# Patient Record
Sex: Female | Born: 1960
Health system: Southern US, Community
[De-identification: ages and names within clinical notes are randomized; demographics above are authoritative.]

## PROBLEM LIST (undated history)

## (undated) DIAGNOSIS — J449 Chronic obstructive pulmonary disease, unspecified: Secondary | ICD-10-CM

## (undated) DIAGNOSIS — R569 Unspecified convulsions: Secondary | ICD-10-CM

## (undated) DIAGNOSIS — R0600 Dyspnea, unspecified: Secondary | ICD-10-CM

## (undated) DIAGNOSIS — F419 Anxiety disorder, unspecified: Secondary | ICD-10-CM

## (undated) DIAGNOSIS — R51 Headache: Secondary | ICD-10-CM

## (undated) DIAGNOSIS — K219 Gastro-esophageal reflux disease without esophagitis: Secondary | ICD-10-CM

## (undated) DIAGNOSIS — R519 Headache, unspecified: Secondary | ICD-10-CM

## (undated) DIAGNOSIS — M1990 Unspecified osteoarthritis, unspecified site: Secondary | ICD-10-CM

## (undated) DIAGNOSIS — E7800 Pure hypercholesterolemia, unspecified: Secondary | ICD-10-CM

## (undated) HISTORY — DX: Pure hypercholesterolemia, unspecified: E78.00

## (undated) HISTORY — PX: CHOLECYSTECTOMY: SHX55

## (undated) HISTORY — PX: DILATION AND CURETTAGE OF UTERUS: SHX78

## (undated) HISTORY — PX: OTHER SURGICAL HISTORY: SHX169

---

## 1988-08-05 DIAGNOSIS — C4492 Squamous cell carcinoma of skin, unspecified: Secondary | ICD-10-CM

## 1988-08-05 HISTORY — DX: Squamous cell carcinoma of skin, unspecified: C44.92

## 1999-05-20 ENCOUNTER — Encounter: Payer: Self-pay | Admitting: *Deleted

## 1999-05-20 ENCOUNTER — Ambulatory Visit (HOSPITAL_COMMUNITY): Admission: RE | Admit: 1999-05-20 | Discharge: 1999-05-20 | Payer: Self-pay | Admitting: *Deleted

## 2000-05-20 ENCOUNTER — Ambulatory Visit (HOSPITAL_COMMUNITY): Admission: RE | Admit: 2000-05-20 | Discharge: 2000-05-20 | Payer: Self-pay | Admitting: *Deleted

## 2000-05-20 ENCOUNTER — Encounter: Payer: Self-pay | Admitting: *Deleted

## 2001-05-25 ENCOUNTER — Other Ambulatory Visit: Admission: RE | Admit: 2001-05-25 | Discharge: 2001-05-25 | Payer: Self-pay | Admitting: *Deleted

## 2001-08-20 ENCOUNTER — Ambulatory Visit (HOSPITAL_COMMUNITY): Admission: RE | Admit: 2001-08-20 | Discharge: 2001-08-20 | Payer: Self-pay | Admitting: Pulmonary Disease

## 2001-11-25 ENCOUNTER — Encounter: Payer: Self-pay | Admitting: *Deleted

## 2001-11-25 ENCOUNTER — Ambulatory Visit (HOSPITAL_COMMUNITY): Admission: RE | Admit: 2001-11-25 | Discharge: 2001-11-25 | Payer: Self-pay | Admitting: Internal Medicine

## 2001-12-06 DIAGNOSIS — D229 Melanocytic nevi, unspecified: Secondary | ICD-10-CM

## 2001-12-06 HISTORY — DX: Melanocytic nevi, unspecified: D22.9

## 2002-06-16 ENCOUNTER — Other Ambulatory Visit: Admission: RE | Admit: 2002-06-16 | Discharge: 2002-06-16 | Payer: Self-pay | Admitting: *Deleted

## 2002-09-06 ENCOUNTER — Ambulatory Visit (HOSPITAL_COMMUNITY): Admission: RE | Admit: 2002-09-06 | Discharge: 2002-09-06 | Payer: Self-pay | Admitting: Pulmonary Disease

## 2002-09-09 ENCOUNTER — Encounter (INDEPENDENT_AMBULATORY_CARE_PROVIDER_SITE_OTHER): Payer: Self-pay | Admitting: *Deleted

## 2002-09-09 ENCOUNTER — Ambulatory Visit (HOSPITAL_COMMUNITY): Admission: RE | Admit: 2002-09-09 | Discharge: 2002-09-09 | Payer: Self-pay | Admitting: General Surgery

## 2003-06-19 ENCOUNTER — Other Ambulatory Visit: Admission: RE | Admit: 2003-06-19 | Discharge: 2003-06-19 | Payer: Self-pay | Admitting: *Deleted

## 2003-11-22 ENCOUNTER — Ambulatory Visit (HOSPITAL_COMMUNITY): Admission: RE | Admit: 2003-11-22 | Discharge: 2003-11-22 | Payer: Self-pay | Admitting: *Deleted

## 2004-06-05 ENCOUNTER — Other Ambulatory Visit: Admission: RE | Admit: 2004-06-05 | Discharge: 2004-06-05 | Payer: Self-pay | Admitting: *Deleted

## 2004-10-16 ENCOUNTER — Ambulatory Visit (HOSPITAL_COMMUNITY): Admission: RE | Admit: 2004-10-16 | Discharge: 2004-10-16 | Payer: Self-pay | Admitting: Pulmonary Disease

## 2004-11-22 ENCOUNTER — Ambulatory Visit (HOSPITAL_COMMUNITY): Admission: RE | Admit: 2004-11-22 | Discharge: 2004-11-22 | Payer: Self-pay | Admitting: *Deleted

## 2004-11-26 ENCOUNTER — Ambulatory Visit: Payer: Self-pay | Admitting: Internal Medicine

## 2005-01-15 ENCOUNTER — Ambulatory Visit: Payer: Self-pay | Admitting: Internal Medicine

## 2005-01-24 ENCOUNTER — Ambulatory Visit: Payer: Self-pay | Admitting: Internal Medicine

## 2005-02-27 ENCOUNTER — Ambulatory Visit: Payer: Self-pay | Admitting: Internal Medicine

## 2005-03-11 ENCOUNTER — Ambulatory Visit: Payer: Self-pay | Admitting: Internal Medicine

## 2005-03-12 ENCOUNTER — Encounter (INDEPENDENT_AMBULATORY_CARE_PROVIDER_SITE_OTHER): Payer: Self-pay | Admitting: *Deleted

## 2005-03-12 ENCOUNTER — Other Ambulatory Visit: Admission: RE | Admit: 2005-03-12 | Discharge: 2005-03-12 | Payer: Self-pay | Admitting: Internal Medicine

## 2005-03-12 ENCOUNTER — Ambulatory Visit: Payer: Self-pay | Admitting: Internal Medicine

## 2005-03-12 HISTORY — PX: ESOPHAGOGASTRODUODENOSCOPY: SHX1529

## 2005-05-23 ENCOUNTER — Ambulatory Visit: Payer: Self-pay | Admitting: Internal Medicine

## 2005-06-02 ENCOUNTER — Ambulatory Visit: Payer: Self-pay | Admitting: Internal Medicine

## 2005-06-04 ENCOUNTER — Ambulatory Visit (HOSPITAL_COMMUNITY): Admission: RE | Admit: 2005-06-04 | Discharge: 2005-06-04 | Payer: Self-pay | Admitting: Internal Medicine

## 2005-06-05 ENCOUNTER — Ambulatory Visit: Payer: Self-pay

## 2005-06-11 ENCOUNTER — Ambulatory Visit: Payer: Self-pay | Admitting: Internal Medicine

## 2005-12-17 ENCOUNTER — Other Ambulatory Visit: Admission: RE | Admit: 2005-12-17 | Discharge: 2005-12-17 | Payer: Self-pay | Admitting: *Deleted

## 2006-06-19 ENCOUNTER — Emergency Department (HOSPITAL_COMMUNITY): Admission: EM | Admit: 2006-06-19 | Discharge: 2006-06-19 | Payer: Self-pay | Admitting: Emergency Medicine

## 2006-06-23 ENCOUNTER — Ambulatory Visit: Payer: Self-pay | Admitting: Internal Medicine

## 2006-07-24 ENCOUNTER — Ambulatory Visit: Payer: Self-pay | Admitting: Internal Medicine

## 2006-11-26 ENCOUNTER — Ambulatory Visit: Payer: Self-pay | Admitting: Cardiology

## 2006-12-03 ENCOUNTER — Encounter: Payer: Self-pay | Admitting: Cardiology

## 2006-12-03 ENCOUNTER — Ambulatory Visit: Payer: Self-pay

## 2006-12-17 ENCOUNTER — Ambulatory Visit: Payer: Self-pay | Admitting: Cardiology

## 2007-05-25 ENCOUNTER — Other Ambulatory Visit: Admission: RE | Admit: 2007-05-25 | Discharge: 2007-05-25 | Payer: Self-pay | Admitting: *Deleted

## 2007-05-28 ENCOUNTER — Ambulatory Visit: Payer: Self-pay | Admitting: Internal Medicine

## 2007-05-28 LAB — CONVERTED CEMR LAB
Albumin: 4.2 g/dL (ref 3.5–5.2)
Basophils Relative: 0.7 % (ref 0.0–1.0)
Bilirubin Urine: NEGATIVE
Crystals: NEGATIVE
Eosinophils Absolute: 0.1 10*3/uL (ref 0.0–0.6)
Eosinophils Relative: 1.5 % (ref 0.0–5.0)
GFR calc non Af Amer: 95 mL/min
HCT: 43.4 % (ref 36.0–46.0)
Lymphocytes Relative: 38.8 % (ref 12.0–46.0)
MCV: 90.4 fL (ref 78.0–100.0)
Mucus, UA: NEGATIVE
Neutro Abs: 3.2 10*3/uL (ref 1.4–7.7)
Neutrophils Relative %: 51.9 % (ref 43.0–77.0)
Potassium: 3.9 meq/L (ref 3.5–5.1)
RBC: 4.8 M/uL (ref 3.87–5.11)
Sodium: 140 meq/L (ref 135–145)
Specific Gravity, Urine: <=1.005 (ref 1.000–1.03)
TSH: 1.75 microintl units/mL (ref 0.35–5.50)
Total Protein, Urine: NEGATIVE mg/dL
Urine Glucose: NEGATIVE mg/dL
WBC: 6 10*3/uL (ref 4.5–10.5)
pH: 6 (ref 5.0–8.0)

## 2007-06-03 ENCOUNTER — Ambulatory Visit: Payer: Self-pay | Admitting: Cardiovascular Disease

## 2007-07-09 ENCOUNTER — Ambulatory Visit: Payer: Self-pay | Admitting: Internal Medicine

## 2009-05-30 ENCOUNTER — Telehealth: Payer: Self-pay | Admitting: Internal Medicine

## 2009-06-01 ENCOUNTER — Ambulatory Visit: Payer: Self-pay | Admitting: Gastroenterology

## 2009-06-01 DIAGNOSIS — R109 Unspecified abdominal pain: Secondary | ICD-10-CM | POA: Insufficient documentation

## 2009-06-01 DIAGNOSIS — K2980 Duodenitis without bleeding: Secondary | ICD-10-CM | POA: Insufficient documentation

## 2009-06-01 DIAGNOSIS — K209 Esophagitis, unspecified without bleeding: Secondary | ICD-10-CM | POA: Insufficient documentation

## 2009-06-01 DIAGNOSIS — R141 Gas pain: Secondary | ICD-10-CM | POA: Insufficient documentation

## 2009-06-01 DIAGNOSIS — R143 Flatulence: Secondary | ICD-10-CM

## 2009-06-01 DIAGNOSIS — R142 Eructation: Secondary | ICD-10-CM

## 2009-06-01 DIAGNOSIS — N809 Endometriosis, unspecified: Secondary | ICD-10-CM | POA: Insufficient documentation

## 2009-06-01 DIAGNOSIS — R1032 Left lower quadrant pain: Secondary | ICD-10-CM | POA: Insufficient documentation

## 2009-06-04 LAB — CONVERTED CEMR LAB
Eosinophils Relative: 1.6 % (ref 0.0–5.0)
HCT: 41 % (ref 36.0–46.0)
Hemoglobin: 14.5 g/dL (ref 12.0–15.0)
Lymphs Abs: 1.7 10*3/uL (ref 0.7–4.0)
Monocytes Relative: 6.5 % (ref 3.0–12.0)
Neutro Abs: 2.5 10*3/uL (ref 1.4–7.7)
RBC: 4.5 M/uL (ref 3.87–5.11)
Sed Rate: 11 mm/hr (ref 0–22)
WBC: 4.6 10*3/uL (ref 4.5–10.5)

## 2009-06-05 ENCOUNTER — Ambulatory Visit: Payer: Self-pay | Admitting: Internal Medicine

## 2009-06-05 ENCOUNTER — Telehealth (INDEPENDENT_AMBULATORY_CARE_PROVIDER_SITE_OTHER): Payer: Self-pay | Admitting: *Deleted

## 2009-06-07 ENCOUNTER — Telehealth: Payer: Self-pay | Admitting: Internal Medicine

## 2009-06-12 ENCOUNTER — Ambulatory Visit: Payer: Self-pay | Admitting: Internal Medicine

## 2009-06-12 ENCOUNTER — Encounter: Payer: Self-pay | Admitting: Internal Medicine

## 2009-06-14 ENCOUNTER — Encounter: Payer: Self-pay | Admitting: Internal Medicine

## 2009-08-22 DIAGNOSIS — F411 Generalized anxiety disorder: Secondary | ICD-10-CM | POA: Insufficient documentation

## 2009-08-28 ENCOUNTER — Ambulatory Visit: Payer: Self-pay | Admitting: Internal Medicine

## 2009-08-28 DIAGNOSIS — Z860100 Personal history of colon polyps, unspecified: Secondary | ICD-10-CM | POA: Insufficient documentation

## 2009-08-29 LAB — CONVERTED CEMR LAB
Ketones, ur: NEGATIVE mg/dL
Specific Gravity, Urine: <=1.005 (ref 1.000–1.030)
pH: 8 (ref 5.0–8.0)

## 2009-09-13 ENCOUNTER — Encounter: Payer: Self-pay | Admitting: Internal Medicine

## 2009-11-16 ENCOUNTER — Ambulatory Visit (HOSPITAL_COMMUNITY): Admission: RE | Admit: 2009-11-16 | Discharge: 2009-11-16 | Payer: Self-pay | Admitting: Obstetrics and Gynecology

## 2010-10-07 ENCOUNTER — Ambulatory Visit (HOSPITAL_COMMUNITY)
Admission: RE | Admit: 2010-10-07 | Discharge: 2010-10-07 | Payer: Self-pay | Source: Home / Self Care | Attending: Pulmonary Disease | Admitting: Pulmonary Disease

## 2011-03-14 NOTE — Op Note (Signed)
NAME:  ATHALENE, KOLLE                          ACCOUNT NO.:  [id number]   MEDICAL RECORD NO.:  [id number]                   PATIENT TYPE:  AMB   LOCATION:  DAY                                  FACILITY:  [facility]   PHYSICIAN:  Dalia Heading, M.D.               DATE OF BIRTH:  Jul 12, 1961   DATE OF PROCEDURE:  09/09/2002  DATE OF DISCHARGE:                                 OPERATIVE REPORT   AGE:  50 years old.   CHIEF COMPLAINT:  Bilary colic, cholelithiasis.   POSTOPERATIVE DIAGNOSIS:  Biliary colic, cholelithiasis.   PROCEDURE:  Laparoscopic cholecystectomy.   SURGEON:  Dalia Heading, M.D.   ANESTHESIA:  General endotracheal.   INDICATIONS:  The patient is a 50 year old white female who presents with  biliary colic secondary to cholelithiasis.  The risks and benefits of the  procedure, including bleeding, infection, hepatobiliary injury, and the  possibility of an open procedure were fully explained to the patient, and  gave informed consent.   PROCEDURE NOTE:  The patient was placed in the supine position.  After  induction of general endotracheal anesthesia, the abdomen was prepped and  draped using the usual sterile technique with Betadine.   A supraumbilical incision was made down to the fascia.  Veress needle was  then introduced into the abdominal cavity, and confirmation of placement was  done using the saline drop test.  The abdomen was then insufflated to 16  mmHg of pressure.  An 11-mm trocar was then introduced into the abdominal  cavity under direct visualization without difficulty.  An additional 11-mm  trocar was placed in the epigastric region, and 5-mm trocars were placed in  the right upper quadrant and right flank regions.  The liver was inspected  and noted to be within normal limits.  The gallbladder was retracted  superiorly and laterally.  The dissection was begun around the infundibulum  of the gallbladder.  The cystic duct was first  identified.  Its junction to  the infundibulum fully identified.  Endoclips were placed proximally and  distally on the cystic duct, and the cystic duct was divided.  This was  likewise done with the cystic artery.   The gallbladder was then freed away from the gallbladder fossa using Bovie  electrocautery.  The gallbladder was delivered through the epigastric trocar  site using an EndoCatch bag.  The gallbladder fossa was inspected.  No  abnormal bleeding or bile leakage was noted.  Surgicel was placed in the  gallbladder fossa.  The subhepatic space as well as the right hepatic gutter  were irrigated with normal saline.  All fluid and air were then evacuated  from the abdominal cavity prior to removal of the trocars.   All wounds were irrigated with normal saline.  All wounds were injected with  0.5% Sensorcaine.  The supraumbilical fascia as well as epigastric fascia  reapproximated using an #  0 Vicryl interrupted suture.  All skin incisions  were closed using staples.  Betadine ointment and dry sterile dressings were  applied.   All instrument and needle counts were correct at the end of the procedure.  The patient was extubated in the operating room and went back to the  recovery room awake in stable condition.   COMPLICATIONS:  None.   SPECIMENS:  Gallbladder with stumps.   BLOOD LOSS:  Minimal.                                                Dalia Heading, M.D.    MAJ/MEDQ  D:  09/09/2002  T:  09/09/2002  Job:  [id number]   cc:   Ramon Dredge L. Juanetta Gosling, M.D.  [address]  [place]  [place] [address]  Fax: [phone]

## 2011-03-14 NOTE — Assessment & Plan Note (Signed)
[facility]                            CARDIOLOGY OFFICE NOTE   NAME:[name], Gabriela PETRONIO                       MRN:          [id number]  DATE:11/26/2006                            DOB:          Jun 07, 1961    Gabriela Maduro received cardiac evaluation.  She had a significant  syncopal episode in August 2007.  Since then, she has had 1 minor spell  of feeling dizzy and possibly presyncopal.  Her workup by Dr. Joana Reamer  showed that there did not appear to be any primary neurologic problem.  She was not allowed to drive for 3 months, and this has passed.  She is  stable and going about full activities.  She has historically worked  doing Education officer, environmental.  At this time, she is sitting with a gentleman who is  being watched, as he is becoming weaker with lung cancer.   In the past, the patient has had vasovagal-type of symptoms.  She had a  very brief syncopal episode with pain in her hand on 1 occasion in the  past.  However, when this has happened in the past, her spells have been  extremely limited, and she wakes up right of way.  The spell in August  2007 the patient had been outside in the heat in her yard, and then was  sitting on the back porch.  She then began to feel poorly, and she  passed out.  This was witnessed by her husband.  This was not a brief  episode.  She had heavy breathing.  There was question of some clonic  contractive type of movement of her left arm and left leg.  There was  some deviation in her eyes.  She did not have urinary or stool  incontinence.  She awoke, and there was some slurring in her speech, and  then she recovered over approximately a week.   At this point the patient does not have chest pain.  There is no  shortness of breath.  There is no family history of sudden cardiac  death.   PAST MEDICAL HISTORY:   ALLERGIES:  CODEINE.   MEDICATIONS:  1. Nexium.  2. Xanax 1 mg at night.   OTHER MEDICAL PROBLEMS:  See the list  below.   SOCIAL HISTORY:  The patient does smoke 1/2 pack per day, and of course  she is encouraged to stop.  She is married.   FAMILY HISTORY:  Both parents have passed away from cancer, and there is  no history of coronary disease at a young age.  There is no history of  sudden cardiac death.   REVIEW OF SYSTEMS:  She is not having any GI or GU symptoms.  In the  past few weeks she has had some discomfort in both shoulders with arm  movement.  This does not sound cardiac in origin.  She has had some very  mild dizzy spells.  Otherwise, her review of systems is negative.   PHYSICAL EXAMINATION:  The patient is well developed and well nourished.  Her husband is present throughout the evaluation.  She is oriented x3.  Affect is normal.  There is no xanthelasma.  There is normal extraocular motion.  There are no carotid bruits.  There is no jugular venous distention.  LUNGS:  Clear.  Respiratory effort is not labored.  CARDIAC:  Exam reveals an S1 with an S2.  There are no clicks or  significant murmurs.  ABDOMEN:  Soft.  There are no masses or bruits.  There is no peripheral edema.  She has 2+ distal pulses.  There are no musculoskeletal deformities.  Weight is 141 pounds.   EKG today is normal.   PROBLEMS:  1. Bilateral shoulder discomfort, which sounds musculoskeletal and      mild.  2. Question of some tendencies towards in the past, but no documented      major panic abnormality.  3. Status post hysterectomy.  4. Smoking 1 pack per day.  5. Allergy to codeine.  6. Episodes in the past that sound like vasovagal syncope.  7. More marked syncopal episode in August 2007 with a period of      prolonged changes including some slurred speech.  At this time, the      exact etiology is not clear.  It is of note that on that day the      patient had worked outside in the heat, and the felt poorly before      she had her spell.  It is possible that she had a type of       neurocardiogenic event added to a dehydrated state, which led to a      more marked neurologic event.  At this time, there are no obvious      cardiac abnormalities.  We will proceed with 2D echocardiogram to      be sure that left ventricular function and her valves are normal.      In addition, she will wear a 3 or 4 week CardioNet monitor, as she      does feel some palpitations rarely from time to time, and she has      had some mild dizziness from time to time.  She appears to be      stable for full activities.  I reminded her to try to be sure that      she remains hydrated.  Also, I reminded her that if she began to      feel somewhat dizzy, that she should stop and lie down and relax.   I will see her for followup.     Luis Abed, MD, [facility]  Electronically Signed    JDK/MedQ  DD: 11/26/2006  DT: 11/26/2006  Job #: [id number]   cc:   Georgina Quint. Plotnikov, MD  Gustavus Messing. Orlin Hilding, M.D.

## 2011-03-14 NOTE — H&P (Signed)
   NAME:  Gabriela [name], Gabriela [name]                          ACCOUNT NO.:  [id number]   MEDICAL RECORD NO.:  [id number]                   PATIENT TYPE:   LOCATION:  RAD                                  FACILITY:  [facility]   PHYSICIAN:  Dalia Heading, M.D.               DATE OF BIRTH:  [birth year]   DATE OF ADMISSION:  09/09/2002  DATE OF DISCHARGE:                                HISTORY & PHYSICAL   CHIEF COMPLAINT:  Biliary colic secondary to cholelithiasis.   HISTORY OF PRESENT ILLNESS:  This patient is a 50 year old white female who  is referred for evaluation and treatment of biliary colic secondary to  cholelithiasis.  She has been having worsening right upper quadrant  abdominal pain, nausea, bloating, and fatty food intolerance over the past  month. This has been getting worse as the time has gone on.  No fever,  chills, or jaundice had been noted.   PAST MEDICAL HISTORY:  Includes an anxiety disorder.   PAST SURGICAL HISTORY:  D&C in October 2003 at [facility],  diagnostic laparoscopy in the past.   CURRENT MEDICATIONS:  Xanax.   ALLERGIES:  Codeine.   SOCIAL HISTORY:  The patient does smoke a pack of cigarettes a day.  She  denies any significant alcohol use.   REVIEW OF SYSTEMS:  She denies any other cardiopulmonary difficulties or  bleeding disorders.   PHYSICAL EXAMINATION:  GENERAL:  The patient is a well-developed, and well-  nourished, white female in no acute distress.  VITAL SIGNS:  She is afebrile and vital signs are stable.  HEENT:  Examination reveals no scleral icterus.  LUNGS:  Clear to auscultation with equal breath sounds bilaterally.  HEART:  Examination reveals a regular rate and rhythm without S3, S4, or  murmurs.  ABDOMEN:  The abdomen is soft with tenderness noted in the right upper  quadrant to palpation.  No hepatosplenomegaly, masses or hernias are noted.   X-RAY AND LABORATORY DATA:  Ultrasound of the gallbladder reveals  cholelithiasis with a normal common bile duct.   IMPRESSION:  1. Biliary colic.  2. Cholelithiasis.   PLAN:  The patient is scheduled for laparoscopic cholecystectomy on  09/09/02.  The risks and benefits of the procedure including bleeding,  infection, hepatobiliary injury, and the possibility of an open procedure  were fully explained to the patient who gave informed consent.                                               Dalia Heading, M.D.    MAJ/MEDQ  D:  09/08/2002  T:  09/08/2002  Job:  [id number]   cc:   Ramon Dredge L. Juanetta Gosling, M.D.  [address]  [place]  [place] [address]  Fax: [phone]

## 2011-03-14 NOTE — Assessment & Plan Note (Signed)
 [facility]                            CARDIOLOGY OFFICE NOTE   NAME:[name], Gabriela NAPIERALA                       MRN:          [id number]  DATE:12/17/2006                            DOB:          01/24/1961    [name] is here for followup. See my complete note of November 26, 2006. The patient had had a syncopal episode. We did decide to proceed  with the 2D echo. That study was done on December 03, 2006 and it is  normal. Also she has worn a CardioNet monitor and she has had no  significant symptoms and the automatic checks have shown sinus rhythm.  She is here today and stable, feeling well, and doing well.   A blood pressure today is 126/95, with a pulse of 90.   Patient is doing well. She has had no recurring symptoms. Problems are  listed on my note of November 26, 2006.   At this point it would appear that her syncopal episode may have been a  neurocardiogenic event when she was dehydrated which led to a more  significant episode of syncope that included some slurred speech. She  has recovered.   I am recommending no other cardiac workup at this time. I will be happy  to see her in the future if needed.     Luis Abed, MD, [facility]  Electronically Signed    JDK/MedQ  DD: 12/17/2006  DT: 12/17/2006  Job #: [id number]   cc:   Georgina Quint. Plotnikov, MD  Gustavus Messing. Orlin Hilding, M.D.

## 2011-07-14 ENCOUNTER — Ambulatory Visit (HOSPITAL_COMMUNITY)
Admission: RE | Admit: 2011-07-14 | Discharge: 2011-07-14 | Disposition: A | Discharge status: Home / Self Care | Payer: BC Managed Care – PPO | Source: Ambulatory Visit | Attending: Pulmonary Disease | Admitting: Pulmonary Disease

## 2011-07-14 ENCOUNTER — Other Ambulatory Visit (HOSPITAL_COMMUNITY): Payer: Self-pay | Admitting: Pulmonary Disease

## 2011-07-14 DIAGNOSIS — R05 Cough: Secondary | ICD-10-CM

## 2011-07-14 DIAGNOSIS — IMO0001 Reserved for inherently not codable concepts without codable children: Secondary | ICD-10-CM

## 2011-07-14 DIAGNOSIS — R059 Cough, unspecified: Secondary | ICD-10-CM

## 2011-12-05 ENCOUNTER — Other Ambulatory Visit (HOSPITAL_COMMUNITY): Payer: Self-pay | Admitting: Pulmonary Disease

## 2011-12-09 ENCOUNTER — Ambulatory Visit (HOSPITAL_COMMUNITY)
Admission: RE | Admit: 2011-12-09 | Discharge: 2011-12-09 | Disposition: A | Discharge status: Home / Self Care | Payer: BC Managed Care – PPO | Source: Ambulatory Visit | Attending: Pulmonary Disease | Admitting: Pulmonary Disease

## 2011-12-09 DIAGNOSIS — R109 Unspecified abdominal pain: Secondary | ICD-10-CM | POA: Insufficient documentation

## 2011-12-09 DIAGNOSIS — R197 Diarrhea, unspecified: Secondary | ICD-10-CM | POA: Insufficient documentation

## 2011-12-09 MED ORDER — IOHEXOL 300 MG/ML  SOLN
100.0000 mL | Freq: Once | INTRAMUSCULAR | Status: AC | PRN
  Administered 2011-12-09: 100 mL via INTRAVENOUS

## 2011-12-17 ENCOUNTER — Encounter (INDEPENDENT_AMBULATORY_CARE_PROVIDER_SITE_OTHER): Payer: Self-pay | Admitting: *Deleted

## 2011-12-29 ENCOUNTER — Encounter (INDEPENDENT_AMBULATORY_CARE_PROVIDER_SITE_OTHER): Payer: Self-pay | Admitting: Internal Medicine

## 2011-12-29 ENCOUNTER — Ambulatory Visit (INDEPENDENT_AMBULATORY_CARE_PROVIDER_SITE_OTHER): Payer: BC Managed Care – PPO | Admitting: Internal Medicine

## 2011-12-29 DIAGNOSIS — R109 Unspecified abdominal pain: Secondary | ICD-10-CM

## 2011-12-29 DIAGNOSIS — K589 Irritable bowel syndrome without diarrhea: Secondary | ICD-10-CM

## 2011-12-29 MED ORDER — DICYCLOMINE HCL 10 MG PO CAPS: 10.0000 mg | ORAL_CAPSULE | Freq: Four times a day (QID) | ORAL | Status: DC

## 2011-12-29 NOTE — Patient Instructions (Signed)
F/u in 1 month. Dicyclomine 10mg  QID.

## 2011-12-29 NOTE — Progress Notes (Signed)
Subjective:     Patient ID: Gabriela [name], female   DOB: 1961/01/01, 51 y.o.   MRN: [id number]  HPI  Gabriela [name] is a 51 yr old female referred to our office by Dr. Juanetta [name] for abdominal pain and bloating.  January 25 of this year, she began to have lower abdominal cramps. She saw her GYN and told was normal exam. US abdomen was normal.  She says sometimes the pain will be in the rt lower quadrant and radiates across. The pain has not been as bad since Friday.  This pain does not have anything to do with her bowel movements. No pain with foods.  She says if she opens a mountain dew, the straining will hurt her lower abdomen.  No rectal bleeding or melena Appetite is good. No weight loss.  Sometimes she will have epigastric pain. BM x 2-3 times a day sometimes.  Her GI evaluation has been negative. A colonoscopy in August 2010 by Gabriela [name] showed mild diverticulosis and 2 adenomatous polyps. She has a history of severe endometriosis.  She continues to have abdominal discomfort which is partially relieved by Advil.     12/09/2011 CT abdomen and pelvis with CM:  12/03/2010 H and H 14.9 and 43.2, NA 139, K 4.4, Creatinine 0.71, bili 0.4, AST 18, ALP 61, ALT 13, Total protein 7.3, Albumin 4.8   Review of Systems   IMPRESSION:  Normal, stable CT scan of the abdomen and pelvis.  Current Outpatient Prescriptions  Medication Sig Dispense Refill  . ALPRAZolam (XANAX) 1 MG tablet Take 1 mg by mouth at bedtime as needed.      . simvastatin (ZOCOR) 40 MG tablet Take 40 mg by mouth every evening.       Past Medical History  Diagnosis Date  . High cholesterol    Past Surgical History  Procedure Date  . Cholecystectomy   . Dilation and curettage of uterus   . Cyst removed from ovaries    History   Social History  . Marital Status: Married    Spouse Name: N/A    Number of Children: N/A  . Years of Education: N/A   Occupational History  . Not on file.   Social History Main Topics  . Smoking  status: Current Everyday Smoker  . Smokeless tobacco: Not on file   Comment: 1/2 pack a day for 20 yrs  . Alcohol Use: No  . Drug Use: No  . Sexually Active: Not on file   Other Topics Concern  . Not on file   Social History Narrative  . No narrative on file   Family Status  Relation Status Death Age  . Mother Deceased     Stomach cancer  . Father Deceased     Prostate caner   Allergies  Allergen Reactions  . Codeine        Objective:   Physical Exam Filed Vitals:   12/29/11 1000  Height: 5\' 2"  (1.575 m)  Weight: 136 lb 12.8 oz (62.052 kg)  Alert and oriented. Skin warm and dry. Oral mucosa is moist.   . Sclera anicteric, conjunctivae is pink. Thyroid not enlarged. No cervical lymphadenopathy. Lungs clear. Heart regular rate and rhythm.  Abdomen is soft. Bowel sounds are positive. No hepatomegaly. No abdominal masses felt. No tenderness.  No edema to lower extremities. Patient is alert and oriented.       Assessment:   Abdominal pain. Doubt this is GI origin. This could be endometriosis.  Possible IBS.  I discussed this case with Dr. Juanetta [name]    Plan:Dicyclomine 10mg  QID. If constipation occurs, try Colace.  OV in 1 month

## 2012-01-06 ENCOUNTER — Ambulatory Visit (INDEPENDENT_AMBULATORY_CARE_PROVIDER_SITE_OTHER): Payer: BC Managed Care – PPO | Admitting: Internal Medicine

## 2012-01-16 ENCOUNTER — Encounter (INDEPENDENT_AMBULATORY_CARE_PROVIDER_SITE_OTHER): Payer: Self-pay

## 2012-01-26 ENCOUNTER — Encounter (INDEPENDENT_AMBULATORY_CARE_PROVIDER_SITE_OTHER): Payer: Self-pay | Admitting: Internal Medicine

## 2012-01-26 ENCOUNTER — Ambulatory Visit (INDEPENDENT_AMBULATORY_CARE_PROVIDER_SITE_OTHER): Payer: BC Managed Care – PPO | Admitting: Internal Medicine

## 2012-01-26 DIAGNOSIS — R109 Unspecified abdominal pain: Secondary | ICD-10-CM

## 2012-01-26 MED ORDER — CILIDINIUM-CHLORDIAZEPOXIDE 2.5-5 MG PO CAPS: 1.0000 | ORAL_CAPSULE | Freq: Three times a day (TID) | ORAL | Status: DC

## 2012-01-26 NOTE — Progress Notes (Signed)
Subjective:     Patient ID: Gabriela [name], female   DOB: July 03, 1961, 51 y.o.   MRN: [id number]  HPI Gabriela [name] is a 51 yr old female here for follow of her lower abdominal pain since January 25,2013.  She tells me the Dicyclomine has really not helped her pain. The pain is actually over ovaries. The pain is occurring every day. The pain starts when she wakes up in the morning.  She says today the pain is located in her left lower abdomen.  She tells me her BMs are normal.  No melena or bright red rectal bleeding.   She tells me the pain feels like she is getting ready to start her period.  Appetite is good. No weight loss. Occasionally has acid reflux.     Gabriela [name] was last seen around the first of March for abdominal pain and bloating.January 25 of this year, she began to have lower abdominal cramps. She saw her GYN and told was normal exam. US abdomen was normal. She says sometimes the pain will be in the rt lower quadrant and radiates across. The pain has not been as bad since Friday. This pain does not have anything to do with her bowel movements. No pain with foods. She says if she opens a mountain dew, the straining will hurt her lower abdomen. No rectal bleeding or melena  Appetite is good. No weight loss. Sometimes she will have epigastric pain. BM x 2-3 times a day sometimes. She was started on Dicyclomine after this visit. This case was discussed with Dr. Karilyn Cota. Her GI evaluation has been negative. A colonoscopy in August 2010 by Bonnye Fava showed mild diverticulosis and 2 adenomatous polyps. She has a history of severe endometriosis. She continues to have abdominal discomfort which is partially relieved by Advil.  12/09/2011 CT abdomen and pelvis with CM: Normal.. [place] 11/25/2011: No rt adneal masses seen. Lt ovary wasnot visualized due to overlying bowel.  No adnexal masses seen. Distended colon seen.   12/03/2010 H and H 14.9 and 43.2, NA 139, K 4.4, Creatinine 0.71, bili 0.4, AST 18, ALP 61, ALT 13,  Total protein 7.3, Albumin 4.8     Review of Systems see hpi Current Outpatient Prescriptions on File Prior to Visit  Medication Sig Dispense Refill  . ALPRAZolam (XANAX) 1 MG tablet Take 1 mg by mouth at bedtime as needed.      . dicyclomine (BENTYL) 10 MG capsule Take 1 capsule (10 mg total) by mouth 4 (four) times daily.  120 capsule  2  . simvastatin (ZOCOR) 40 MG tablet Take 40 mg by mouth every evening.      . clidinium-chlordiazePOXIDE (LIBRAX) 2.5-5 MG per capsule Take 1 capsule by mouth 4 (four) times daily -  before meals and at bedtime.  120 capsule  2   Past Medical History  Diagnosis Date  . High cholesterol   . Endometriosis      Past Surgical History  Procedure Date  . Cholecystectomy   . Dilation and curettage of uterus   . Cyst removed from ovaries   . Hysterescopy    History   Social History  . Marital Status: Married    Spouse Name: N/A    Number of Children: N/A  . Years of Education: N/A   Occupational History  . Not on file.   Social History Main Topics  . Smoking status: Current Everyday Smoker  . Smokeless tobacco: Not on file   Comment: 1/2 pack a day  for 20 yrs  . Alcohol Use: No  . Drug Use: No  . Sexually Active: Not on file   Other Topics Concern  . Not on file   Social History Narrative  . No narrative on file   Family Status  Relation Status Death Age  . Mother Deceased     Stomach cancer  . Father Deceased     Prostate caner   Allergies  Allergen Reactions  . Codeine        Objective:   Physical Exam Filed Vitals:   01/26/12 1445  Height: 5\' 2"  (1.575 m)  Weight: 139 lb 6.4 oz (63.231 kg)   Alert and oriented. Skin warm and dry. Oral mucosa is moist.   . Sclera anicteric, conjunctivae is pink. Thyroid not enlarged. No cervical lymphadenopathy. Lungs clear. Heart regular rate and rhythm.  Abdomen is soft. Bowel sounds are positive. No hepatomegaly. No abdominal masses felt. No tenderness on exam.  No edema to lower  extremities. Patient is alert and oriented.      Assessment:   Abdominal pain. ? Etiology. Possible  Endometriosis. Normal exam today.     Plan:    Will start on Librax QID. OV in 2 months with Dr. Karilyn Cota. Will repeat an US pelvis since left ovary was not visualized.

## 2012-01-26 NOTE — Patient Instructions (Signed)
Discharge Instructions Browse by Alphabet A B C D E F G H I J K L M N O P Q R S T U V W X Y Z  Browse by Category All Documents Allergy and Immunology Anesthesiology Bariatrics Bioterrorism Cardiology Critical Care Dentistry Dermatology Diabetes Dietary Easy-to-Read Emergency Medicine Endocrinology ENT Family Medicine Forms Gastroenterology Geriatrics Infectious Disease Internal Medicine Labs and Tests Neonatology Nephrology Neurology Obstetrics and Gynecology Oncology Ophthalmology Orthopedics Pediatrics Pharmacology Physical Medicine and Rehabilitation Podiatry Preventative Medicine Procedures Psychiatry Pulmonary Medicine Radiology Rheumatology Surgery Urology Drug Information Sheets All Drug Information Sheets  Browse by Alphabet A B C D E F G H I J K L M N O P Q R S T U V W X Y Z Discharge Instructions Browse by Alphabet A B C D E F G H I J K L M N O P Q R S T U V W X Y Z  Browse by Category All Documents Allergy and Immunology Anesthesiology [facility] Bioterrorism Cardiology Critical Care Dentistry Dermatology Diabetes Dietary Easy-to-Read Emergency Medicine Endocrinology ENT Family Medicine Forms Gastroenterology Geriatrics Infectious Disease Internal Medicine Labs and Tests Neonatology Nephrology Neurology Obstetrics and Gynecology Oncology Ophthalmology Orthopedics Pediatrics Pharmacology Physical Medicine and Rehabilitation Podiatry Preventative Medicine Procedures Psychiatry Pulmonary Medicine Radiology Rheumatology Surgery Urology Drug Information Sheets All Drug Information Sheets  Browse by Alphabet A B C D E F G H I J K L M N O P Q R S T U V W X Y Z

## 2012-03-29 ENCOUNTER — Ambulatory Visit (INDEPENDENT_AMBULATORY_CARE_PROVIDER_SITE_OTHER): Payer: BC Managed Care – PPO | Admitting: Internal Medicine

## 2012-06-17 ENCOUNTER — Other Ambulatory Visit: Payer: Self-pay | Admitting: Obstetrics and Gynecology

## 2012-06-17 DIAGNOSIS — R102 Pelvic and perineal pain: Secondary | ICD-10-CM

## 2012-06-17 DIAGNOSIS — G8929 Other chronic pain: Secondary | ICD-10-CM

## 2012-06-21 ENCOUNTER — Ambulatory Visit
Admission: RE | Admit: 2012-06-21 | Discharge: 2012-06-21 | Disposition: A | Discharge status: Home / Self Care | Payer: BC Managed Care – PPO | Source: Ambulatory Visit | Attending: Obstetrics and Gynecology | Admitting: Obstetrics and Gynecology

## 2012-06-21 DIAGNOSIS — R102 Pelvic and perineal pain: Secondary | ICD-10-CM

## 2012-06-21 DIAGNOSIS — G8929 Other chronic pain: Secondary | ICD-10-CM

## 2012-06-21 MED ORDER — GADOBENATE DIMEGLUMINE 529 MG/ML IV SOLN
12.0000 mL | Freq: Once | INTRAVENOUS | Status: AC | PRN
  Administered 2012-06-21: 12 mL via INTRAVENOUS

## 2013-04-14 ENCOUNTER — Other Ambulatory Visit (HOSPITAL_COMMUNITY): Payer: Self-pay

## 2013-04-14 DIAGNOSIS — R0602 Shortness of breath: Secondary | ICD-10-CM

## 2013-04-27 ENCOUNTER — Other Ambulatory Visit (HOSPITAL_COMMUNITY): Payer: Self-pay | Admitting: Pulmonary Disease

## 2013-04-27 ENCOUNTER — Ambulatory Visit (HOSPITAL_COMMUNITY)
Admission: RE | Admit: 2013-04-27 | Discharge: 2013-04-27 | Disposition: A | Discharge status: Home / Self Care | Payer: BC Managed Care – PPO | Source: Ambulatory Visit | Attending: Pulmonary Disease | Admitting: Pulmonary Disease

## 2013-04-27 DIAGNOSIS — R0989 Other specified symptoms and signs involving the circulatory and respiratory systems: Secondary | ICD-10-CM | POA: Insufficient documentation

## 2013-04-27 DIAGNOSIS — R0602 Shortness of breath: Secondary | ICD-10-CM | POA: Insufficient documentation

## 2013-04-27 DIAGNOSIS — R0609 Other forms of dyspnea: Secondary | ICD-10-CM | POA: Insufficient documentation

## 2013-05-06 NOTE — Procedures (Signed)
NAME:  CLARICE, ZULAUF                ACCOUNT NO.:  [id number]  MEDICAL RECORD NO.:  [id number]  LOCATION:                                 FACILITY:  PHYSICIAN:  Margaretta Chittum L. Juanetta Gosling, M.D.DATE OF BIRTH:  1961/03/12  DATE OF PROCEDURE:  05/05/2013 DATE OF DISCHARGE:                           PULMONARY FUNCTION TEST   REASON FOR PULMONARY FUNCTION TESTING: 1. Spirometry does not show a definite ventilatory defect, but does     show evidence of airflow obstruction in the smaller airways. 2. Lung volumes are normal. 3. DLCO is mildly reduced, but corrects somewhat when ventilation is     taken into account. 4. Airway resistance is normal. 5. This study show minimal obstructive airway disease.     Isamu Trammel L. Juanetta Gosling, M.D.     ELH/MEDQ  D:  05/05/2013  T:  05/06/2013  Job:  [id number]

## 2013-05-10 LAB — PULMONARY FUNCTION TEST

## 2014-04-24 ENCOUNTER — Telehealth: Payer: Self-pay | Admitting: Internal Medicine

## 2014-04-24 NOTE — Telephone Encounter (Signed)
Recall Assessment review 06-2014/yf

## 2014-06-02 ENCOUNTER — Encounter: Payer: Self-pay | Admitting: Internal Medicine

## 2014-11-28 ENCOUNTER — Other Ambulatory Visit (HOSPITAL_COMMUNITY): Payer: Self-pay | Admitting: Pulmonary Disease

## 2014-11-28 ENCOUNTER — Ambulatory Visit (HOSPITAL_COMMUNITY)
Admission: RE | Admit: 2014-11-28 | Discharge: 2014-11-28 | Disposition: A | Discharge status: Home / Self Care | Payer: 59 | Source: Ambulatory Visit | Attending: Pulmonary Disease | Admitting: Pulmonary Disease

## 2014-11-28 DIAGNOSIS — M545 Low back pain: Secondary | ICD-10-CM

## 2014-12-06 ENCOUNTER — Other Ambulatory Visit (HOSPITAL_COMMUNITY): Payer: Self-pay | Admitting: Pulmonary Disease

## 2014-12-06 DIAGNOSIS — M545 Low back pain, unspecified: Secondary | ICD-10-CM

## 2014-12-07 ENCOUNTER — Other Ambulatory Visit: Payer: Self-pay | Admitting: Pulmonary Disease

## 2014-12-07 DIAGNOSIS — M545 Low back pain, unspecified: Secondary | ICD-10-CM

## 2014-12-14 ENCOUNTER — Ambulatory Visit (HOSPITAL_COMMUNITY): Payer: 59

## 2014-12-17 ENCOUNTER — Ambulatory Visit
Admission: RE | Admit: 2014-12-17 | Discharge: 2014-12-17 | Disposition: A | Discharge status: Home / Self Care | Payer: 59 | Source: Ambulatory Visit | Attending: Pulmonary Disease | Admitting: Pulmonary Disease

## 2014-12-17 ENCOUNTER — Other Ambulatory Visit: Payer: 59

## 2014-12-17 DIAGNOSIS — M545 Low back pain, unspecified: Secondary | ICD-10-CM

## 2016-01-03 ENCOUNTER — Encounter (INDEPENDENT_AMBULATORY_CARE_PROVIDER_SITE_OTHER): Payer: Self-pay | Admitting: *Deleted

## 2016-04-18 DIAGNOSIS — M4806 Spinal stenosis, lumbar region: Secondary | ICD-10-CM | POA: Diagnosis not present

## 2016-04-18 DIAGNOSIS — J441 Chronic obstructive pulmonary disease with (acute) exacerbation: Secondary | ICD-10-CM | POA: Diagnosis not present

## 2016-04-18 DIAGNOSIS — M545 Low back pain: Secondary | ICD-10-CM | POA: Diagnosis not present

## 2016-04-18 DIAGNOSIS — F419 Anxiety disorder, unspecified: Secondary | ICD-10-CM | POA: Diagnosis not present

## 2016-04-18 DIAGNOSIS — M5416 Radiculopathy, lumbar region: Secondary | ICD-10-CM | POA: Diagnosis not present

## 2016-04-24 ENCOUNTER — Other Ambulatory Visit: Payer: Self-pay | Admitting: Neurosurgery

## 2016-04-24 DIAGNOSIS — M5416 Radiculopathy, lumbar region: Secondary | ICD-10-CM

## 2016-05-16 DIAGNOSIS — J029 Acute pharyngitis, unspecified: Secondary | ICD-10-CM | POA: Diagnosis not present

## 2016-05-21 ENCOUNTER — Other Ambulatory Visit (HOSPITAL_COMMUNITY): Payer: Self-pay | Admitting: Pulmonary Disease

## 2016-05-21 ENCOUNTER — Ambulatory Visit (HOSPITAL_COMMUNITY)
Admission: RE | Admit: 2016-05-21 | Discharge: 2016-05-21 | Disposition: A | Discharge status: Home / Self Care | Payer: BLUE CROSS/BLUE SHIELD | Source: Ambulatory Visit | Attending: Pulmonary Disease | Admitting: Pulmonary Disease

## 2016-05-21 DIAGNOSIS — R05 Cough: Secondary | ICD-10-CM | POA: Insufficient documentation

## 2016-05-21 DIAGNOSIS — J449 Chronic obstructive pulmonary disease, unspecified: Secondary | ICD-10-CM | POA: Diagnosis not present

## 2016-05-21 DIAGNOSIS — J312 Chronic pharyngitis: Secondary | ICD-10-CM | POA: Diagnosis not present

## 2016-05-21 DIAGNOSIS — R059 Cough, unspecified: Secondary | ICD-10-CM

## 2016-05-21 DIAGNOSIS — M545 Low back pain: Secondary | ICD-10-CM | POA: Diagnosis not present

## 2016-05-21 DIAGNOSIS — F419 Anxiety disorder, unspecified: Secondary | ICD-10-CM | POA: Diagnosis not present

## 2016-05-22 DIAGNOSIS — M545 Low back pain: Secondary | ICD-10-CM | POA: Diagnosis not present

## 2016-05-22 DIAGNOSIS — F419 Anxiety disorder, unspecified: Secondary | ICD-10-CM | POA: Diagnosis not present

## 2016-05-22 DIAGNOSIS — J449 Chronic obstructive pulmonary disease, unspecified: Secondary | ICD-10-CM | POA: Diagnosis not present

## 2016-05-22 DIAGNOSIS — J312 Chronic pharyngitis: Secondary | ICD-10-CM | POA: Diagnosis not present

## 2016-05-29 ENCOUNTER — Other Ambulatory Visit: Payer: Self-pay | Admitting: Acute Care

## 2016-05-29 DIAGNOSIS — F17210 Nicotine dependence, cigarettes, uncomplicated: Secondary | ICD-10-CM

## 2016-06-02 ENCOUNTER — Ambulatory Visit (INDEPENDENT_AMBULATORY_CARE_PROVIDER_SITE_OTHER): Payer: BLUE CROSS/BLUE SHIELD | Admitting: Otolaryngology

## 2016-06-02 DIAGNOSIS — K219 Gastro-esophageal reflux disease without esophagitis: Secondary | ICD-10-CM

## 2016-06-02 DIAGNOSIS — R070 Pain in throat: Secondary | ICD-10-CM

## 2016-06-16 ENCOUNTER — Ambulatory Visit (INDEPENDENT_AMBULATORY_CARE_PROVIDER_SITE_OTHER)
Admission: RE | Admit: 2016-06-16 | Discharge: 2016-06-16 | Disposition: A | Discharge status: Home / Self Care | Payer: BLUE CROSS/BLUE SHIELD | Source: Ambulatory Visit | Attending: Acute Care | Admitting: Acute Care

## 2016-06-16 ENCOUNTER — Ambulatory Visit (INDEPENDENT_AMBULATORY_CARE_PROVIDER_SITE_OTHER): Payer: BLUE CROSS/BLUE SHIELD | Admitting: Acute Care

## 2016-06-16 ENCOUNTER — Encounter: Payer: Self-pay | Admitting: Acute Care

## 2016-06-16 DIAGNOSIS — F17210 Nicotine dependence, cigarettes, uncomplicated: Secondary | ICD-10-CM

## 2016-06-16 DIAGNOSIS — Z87891 Personal history of nicotine dependence: Secondary | ICD-10-CM | POA: Diagnosis not present

## 2016-06-16 NOTE — Progress Notes (Signed)
Shared Decision Making Visit Lung Cancer Screening Program [phone])   Eligibility:  Age 55 y.o.  Pack Years Smoking History Calculation 35 pack year smoking history (# packs/per year x # years smoked)  Recent History of coughing up blood  no  Unexplained weight loss? no ( >Than 15 pounds within the last 6 months )  Prior History Lung / other cancer no (Diagnosis within the last 5 years already requiring surveillance chest CT Scans).  Smoking Status Current Smoker  Former Smokers: Years since quit: NA  Quit Date: NA  Visit Components:  Discussion included one or more decision making aids. yes  Discussion included risk/benefits of screening. yes  Discussion included potential follow up diagnostic testing for abnormal scans. yes  Discussion included meaning and risk of over diagnosis. yes  Discussion included meaning and risk of False Positives. yes  Discussion included meaning of total radiation exposure. yes  Counseling Included:  Importance of adherence to annual lung cancer LDCT screening. yes  Impact of comorbidities on ability to participate in the program. yes  Ability and willingness to under diagnostic treatment. yes  Smoking Cessation Counseling:  Current Smokers:   Discussed importance of smoking cessation. yes  Information about tobacco cessation classes and interventions provided to patient. yes  Patient provided with "ticket" for LDCT Scan. yes  Symptomatic Patient. no  Counseling;NA  Diagnosis Code: Tobacco Use Z72.0  Asymptomatic Patient yes  Counseling (Intermediate counseling: > three minutes counseling) [id number]  Former Smokers:   Discussed the importance of maintaining cigarette abstinence. yes  Diagnosis Code: Personal History of Nicotine Dependence. [id number]  Information about tobacco cessation classes and interventions provided to patient. Yes  Patient provided with "ticket" for LDCT Scan. yes  Written Order for Lung Cancer  Screening with LDCT placed in Epic. Yes (CT Chest Lung Cancer Screening Low Dose W/O CM) [id number] Z12.2-Screening of respiratory organs Z87.891-Personal history of nicotine dependence  I have spent 20 minutes of face to face time with Gabriela [name] discussing the risks and benefits of lung cancer screening. We viewed a power point together that explained in detail the above noted topics. We paused at intervals to allow for questions to be asked and answered to ensure understanding.We discussed that the single most powerful action that she can take to decrease the risk of developing lung cancer is to quit smoking. We discussed whether or not she is ready to commit to setting a quit date. She is currently not ready to set a quit date but is working hard on her own to decrease the number of cigarettes she smokes daily.We discussed options for tools to aid in quitting smoking including nicotine replacement therapy, non-nicotine medications, support groups, Quit Smart classes, and behavior modification. We discussed that often times setting smaller, more achievable goals, such as eliminating 1 cigarette a day for a week and then 2 cigarettes a day for a week can be helpful in slowly decreasing the number of cigarettes smoked. This allows for a sense of accomplishment as well as providing a clinical benefit. I gave her the " Be Stronger Than Your Excuses" card with contact information for community resources, classes, free nicotine replacement therapy, and access to mobile apps, text messaging, and on-line smoking cessation help. I have also given her my card and contact information in the event she needs to contact me. We discussed the time and location of the scan, and that either June Leap, CMA, or I will call with the results within 24-48 hours of receiving  them. I have provided her with a copy of the power point we viewed  as a resource in the event they need reinforcement of the concepts we discussed today in the  office. The patient verbalized understanding of all of  the above and had no further questions upon leaving the office. They have my contact information in the event they have any further questions.   Magdalen Spatz, NP 06/16/2016

## 2016-06-17 ENCOUNTER — Telehealth: Payer: Self-pay | Admitting: Acute Care

## 2016-06-17 DIAGNOSIS — F17210 Nicotine dependence, cigarettes, uncomplicated: Secondary | ICD-10-CM

## 2016-06-17 NOTE — Telephone Encounter (Signed)
The results of the CT chest lung cancer screening CT were called to the patient. I explained to Mrs. Eaker that her scan was read as a Lung RADS 1, negative study: no nodules or definitely benign nodules. Radiology recommendation is for a repeat LDCT in 12 months. I also explained the scan indicated aortic atherosclerosis and coronary artery calcification. Mrs. Mohs has been prescribed Zocor by her primary care physician Dr. Luan Pulling for elevated cholesterol, however she has not been taking this medication. I explained to her that she may want to call Dr. Luan Pulling and get restarted on the medication. I told her that I would be faxing him the results of the CT scan in order to allow him access to her complete medical record. She verbalized understanding of the above and had no further questions at completion of the phone call. She has my contact information in the event she has any further questions in the future.

## 2016-06-19 ENCOUNTER — Ambulatory Visit (INDEPENDENT_AMBULATORY_CARE_PROVIDER_SITE_OTHER): Payer: BLUE CROSS/BLUE SHIELD | Admitting: Otolaryngology

## 2016-06-19 DIAGNOSIS — R070 Pain in throat: Secondary | ICD-10-CM | POA: Diagnosis not present

## 2016-06-19 DIAGNOSIS — K219 Gastro-esophageal reflux disease without esophagitis: Secondary | ICD-10-CM

## 2016-06-21 ENCOUNTER — Other Ambulatory Visit: Payer: Self-pay | Admitting: Pulmonary Disease

## 2016-06-21 DIAGNOSIS — M545 Low back pain: Secondary | ICD-10-CM

## 2016-07-01 DIAGNOSIS — K210 Gastro-esophageal reflux disease with esophagitis: Secondary | ICD-10-CM | POA: Diagnosis not present

## 2016-07-01 DIAGNOSIS — J029 Acute pharyngitis, unspecified: Secondary | ICD-10-CM | POA: Diagnosis not present

## 2016-07-05 ENCOUNTER — Ambulatory Visit
Admission: RE | Admit: 2016-07-05 | Discharge: 2016-07-05 | Disposition: A | Discharge status: Home / Self Care | Payer: BLUE CROSS/BLUE SHIELD | Source: Ambulatory Visit | Attending: Pulmonary Disease | Admitting: Pulmonary Disease

## 2016-07-05 DIAGNOSIS — M545 Low back pain: Secondary | ICD-10-CM

## 2016-07-05 DIAGNOSIS — M4806 Spinal stenosis, lumbar region: Secondary | ICD-10-CM | POA: Diagnosis not present

## 2016-07-14 DIAGNOSIS — M5416 Radiculopathy, lumbar region: Secondary | ICD-10-CM | POA: Diagnosis not present

## 2016-07-14 DIAGNOSIS — M5126 Other intervertebral disc displacement, lumbar region: Secondary | ICD-10-CM | POA: Diagnosis not present

## 2016-07-14 DIAGNOSIS — M4806 Spinal stenosis, lumbar region: Secondary | ICD-10-CM | POA: Diagnosis not present

## 2016-07-21 ENCOUNTER — Ambulatory Visit (INDEPENDENT_AMBULATORY_CARE_PROVIDER_SITE_OTHER): Payer: BLUE CROSS/BLUE SHIELD | Admitting: Otolaryngology

## 2016-07-21 DIAGNOSIS — K219 Gastro-esophageal reflux disease without esophagitis: Secondary | ICD-10-CM

## 2016-07-21 DIAGNOSIS — R070 Pain in throat: Secondary | ICD-10-CM | POA: Diagnosis not present

## 2016-07-23 DIAGNOSIS — M5126 Other intervertebral disc displacement, lumbar region: Secondary | ICD-10-CM | POA: Diagnosis not present

## 2016-07-23 DIAGNOSIS — Z6826 Body mass index (BMI) 26.0-26.9, adult: Secondary | ICD-10-CM | POA: Diagnosis not present

## 2016-07-23 DIAGNOSIS — M5416 Radiculopathy, lumbar region: Secondary | ICD-10-CM | POA: Diagnosis not present

## 2016-07-28 ENCOUNTER — Other Ambulatory Visit (INDEPENDENT_AMBULATORY_CARE_PROVIDER_SITE_OTHER): Payer: Self-pay | Admitting: *Deleted

## 2016-07-28 DIAGNOSIS — Z860100 Personal history of colon polyps, unspecified: Secondary | ICD-10-CM | POA: Insufficient documentation

## 2016-08-11 DIAGNOSIS — Z6826 Body mass index (BMI) 26.0-26.9, adult: Secondary | ICD-10-CM | POA: Diagnosis not present

## 2016-08-11 DIAGNOSIS — M48062 Spinal stenosis, lumbar region with neurogenic claudication: Secondary | ICD-10-CM | POA: Diagnosis not present

## 2016-08-21 DIAGNOSIS — F17200 Nicotine dependence, unspecified, uncomplicated: Secondary | ICD-10-CM | POA: Diagnosis not present

## 2016-08-21 DIAGNOSIS — J449 Chronic obstructive pulmonary disease, unspecified: Secondary | ICD-10-CM | POA: Diagnosis not present

## 2016-08-21 DIAGNOSIS — K210 Gastro-esophageal reflux disease with esophagitis: Secondary | ICD-10-CM | POA: Diagnosis not present

## 2016-08-21 DIAGNOSIS — M545 Low back pain: Secondary | ICD-10-CM | POA: Diagnosis not present

## 2016-08-22 ENCOUNTER — Other Ambulatory Visit (INDEPENDENT_AMBULATORY_CARE_PROVIDER_SITE_OTHER): Payer: Self-pay | Admitting: Otolaryngology

## 2016-08-22 DIAGNOSIS — R221 Localized swelling, mass and lump, neck: Secondary | ICD-10-CM

## 2016-08-29 ENCOUNTER — Ambulatory Visit (HOSPITAL_COMMUNITY)
Admission: RE | Admit: 2016-08-29 | Discharge: 2016-08-29 | Disposition: A | Discharge status: Home / Self Care | Payer: BLUE CROSS/BLUE SHIELD | Source: Ambulatory Visit | Attending: Otolaryngology | Admitting: Otolaryngology

## 2016-08-29 DIAGNOSIS — R221 Localized swelling, mass and lump, neck: Secondary | ICD-10-CM | POA: Diagnosis not present

## 2016-08-29 MED ORDER — IOPAMIDOL (ISOVUE-300) INJECTION 61%
75.0000 mL | Freq: Once | INTRAVENOUS | Status: AC | PRN
  Administered 2016-08-29: 75 mL via INTRAVENOUS

## 2016-09-12 DIAGNOSIS — J019 Acute sinusitis, unspecified: Secondary | ICD-10-CM | POA: Diagnosis not present

## 2016-09-12 DIAGNOSIS — M545 Low back pain: Secondary | ICD-10-CM | POA: Diagnosis not present

## 2016-09-12 DIAGNOSIS — J449 Chronic obstructive pulmonary disease, unspecified: Secondary | ICD-10-CM | POA: Diagnosis not present

## 2016-09-23 DIAGNOSIS — Z1231 Encounter for screening mammogram for malignant neoplasm of breast: Secondary | ICD-10-CM | POA: Diagnosis not present

## 2016-09-23 DIAGNOSIS — Z01419 Encounter for gynecological examination (general) (routine) without abnormal findings: Secondary | ICD-10-CM | POA: Diagnosis not present

## 2016-09-23 DIAGNOSIS — Z6825 Body mass index (BMI) 25.0-25.9, adult: Secondary | ICD-10-CM | POA: Diagnosis not present

## 2016-09-24 DIAGNOSIS — M5116 Intervertebral disc disorders with radiculopathy, lumbar region: Secondary | ICD-10-CM | POA: Diagnosis not present

## 2016-09-24 DIAGNOSIS — M5126 Other intervertebral disc displacement, lumbar region: Secondary | ICD-10-CM | POA: Diagnosis not present

## 2016-09-24 DIAGNOSIS — M48062 Spinal stenosis, lumbar region with neurogenic claudication: Secondary | ICD-10-CM | POA: Diagnosis not present

## 2016-09-24 HISTORY — PX: BACK SURGERY: SHX140

## 2016-10-08 ENCOUNTER — Telehealth (INDEPENDENT_AMBULATORY_CARE_PROVIDER_SITE_OTHER): Payer: Self-pay | Admitting: *Deleted

## 2016-10-08 ENCOUNTER — Encounter (INDEPENDENT_AMBULATORY_CARE_PROVIDER_SITE_OTHER): Payer: Self-pay | Admitting: *Deleted

## 2016-10-08 NOTE — Telephone Encounter (Signed)
Patient needs trilyte 

## 2016-10-09 MED ORDER — PEG 3350-KCL-NA BICARB-NACL 420 G PO SOLR: 4000.0000 mL | Freq: Once | ORAL | 0 refills | Status: AC

## 2016-11-13 ENCOUNTER — Ambulatory Visit (INDEPENDENT_AMBULATORY_CARE_PROVIDER_SITE_OTHER): Payer: BLUE CROSS/BLUE SHIELD | Admitting: Otolaryngology

## 2016-12-30 ENCOUNTER — Telehealth (INDEPENDENT_AMBULATORY_CARE_PROVIDER_SITE_OTHER): Payer: Self-pay | Admitting: *Deleted

## 2016-12-30 DIAGNOSIS — L570 Actinic keratosis: Secondary | ICD-10-CM | POA: Diagnosis not present

## 2016-12-30 DIAGNOSIS — D229 Melanocytic nevi, unspecified: Secondary | ICD-10-CM | POA: Diagnosis not present

## 2016-12-30 DIAGNOSIS — L821 Other seborrheic keratosis: Secondary | ICD-10-CM | POA: Diagnosis not present

## 2016-12-30 NOTE — Telephone Encounter (Signed)
Referring MD/PCP: hawkins   Procedure: tcs  Reason/Indication:  Hx poylps  Has patient had this procedure before?  Yes, 7 yrs ago -- scanned  If so, when, by whom and where?    Is there a family history of colon cancer?  no  Who?  What age when diagnosed?    Is patient diabetic?   no      Does patient have prosthetic heart valve or mechanical valve?  no  Do you have a pacemaker?  no  Has patient ever had endocarditis? no  Has patient had joint replacement within last 12 months?  no  Does patient tend to be constipated or take laxatives? no  Does patient have a history of alcohol/drug use?  no  Is patient on Coumadin, Plavix and/or Aspirin? no  Medications: Advil bid, xanax 1 mg nightly at 8 pm, omeprazole 20 mg daily, ranitidine 150 mg daily     NO NEED FOR PROPOFOL PER DR [facility]  Allergies: codeine  Medication Adjustment per Dr Laural Golden:   Procedure date & time: 01/28/17 at 730

## 2016-12-31 NOTE — Telephone Encounter (Signed)
agree

## 2017-01-28 ENCOUNTER — Encounter (HOSPITAL_COMMUNITY): Payer: Self-pay | Admitting: *Deleted

## 2017-01-28 ENCOUNTER — Encounter (HOSPITAL_COMMUNITY)
Admission: RE | Disposition: A | Discharge status: Home Health | Payer: Self-pay | Source: Ambulatory Visit | Attending: Internal Medicine

## 2017-01-28 ENCOUNTER — Ambulatory Visit (HOSPITAL_COMMUNITY)
Admission: RE | Admit: 2017-01-28 | Discharge: 2017-01-28 | Disposition: A | Discharge status: Home Health | Payer: BLUE CROSS/BLUE SHIELD | Source: Ambulatory Visit | Attending: Internal Medicine | Admitting: Internal Medicine

## 2017-01-28 DIAGNOSIS — Z860100 Personal history of colon polyps, unspecified: Secondary | ICD-10-CM | POA: Insufficient documentation

## 2017-01-28 DIAGNOSIS — K644 Residual hemorrhoidal skin tags: Secondary | ICD-10-CM | POA: Diagnosis not present

## 2017-01-28 DIAGNOSIS — F419 Anxiety disorder, unspecified: Secondary | ICD-10-CM | POA: Insufficient documentation

## 2017-01-28 DIAGNOSIS — J449 Chronic obstructive pulmonary disease, unspecified: Secondary | ICD-10-CM | POA: Diagnosis not present

## 2017-01-28 DIAGNOSIS — Z1211 Encounter for screening for malignant neoplasm of colon: Secondary | ICD-10-CM | POA: Insufficient documentation

## 2017-01-28 DIAGNOSIS — K219 Gastro-esophageal reflux disease without esophagitis: Secondary | ICD-10-CM | POA: Diagnosis not present

## 2017-01-28 DIAGNOSIS — D122 Benign neoplasm of ascending colon: Secondary | ICD-10-CM | POA: Diagnosis not present

## 2017-01-28 DIAGNOSIS — E7800 Pure hypercholesterolemia, unspecified: Secondary | ICD-10-CM | POA: Insufficient documentation

## 2017-01-28 DIAGNOSIS — K6289 Other specified diseases of anus and rectum: Secondary | ICD-10-CM | POA: Diagnosis not present

## 2017-01-28 DIAGNOSIS — F17210 Nicotine dependence, cigarettes, uncomplicated: Secondary | ICD-10-CM | POA: Diagnosis not present

## 2017-01-28 HISTORY — DX: Dyspnea, unspecified: R06.00

## 2017-01-28 HISTORY — DX: Headache: R51

## 2017-01-28 HISTORY — DX: Unspecified convulsions: R56.9

## 2017-01-28 HISTORY — PX: COLONOSCOPY: SHX5424

## 2017-01-28 HISTORY — DX: Headache, unspecified: R51.9

## 2017-01-28 HISTORY — PX: POLYPECTOMY: SHX5525

## 2017-01-28 HISTORY — DX: Gastro-esophageal reflux disease without esophagitis: K21.9

## 2017-01-28 HISTORY — DX: Unspecified osteoarthritis, unspecified site: M19.90

## 2017-01-28 HISTORY — DX: Chronic obstructive pulmonary disease, unspecified: J44.9

## 2017-01-28 HISTORY — DX: Anxiety disorder, unspecified: F41.9

## 2017-01-28 SURGERY — COLONOSCOPY
Anesthesia: Moderate Sedation

## 2017-01-28 MED ORDER — MEPERIDINE HCL 50 MG/ML IJ SOLN
INTRAMUSCULAR | Status: DC | PRN
  Administered 2017-01-28 (×2): 25 mg via INTRAVENOUS

## 2017-01-28 MED ORDER — SODIUM CHLORIDE 0.9 % IV SOLN
INTRAVENOUS | Status: DC
  Administered 2017-01-28: 1000 mL via INTRAVENOUS

## 2017-01-28 MED ORDER — SIMETHICONE 40 MG/0.6ML PO SUSP
ORAL | Status: DC
Start: 2017-01-28 — End: 2017-01-28
  Filled 2017-01-28: qty 30

## 2017-01-28 MED ORDER — MIDAZOLAM HCL 5 MG/5ML IJ SOLN
INTRAMUSCULAR | Status: DC | PRN
  Administered 2017-01-28 (×5): 2 mg via INTRAVENOUS

## 2017-01-28 MED ORDER — STERILE WATER FOR IRRIGATION IR SOLN
Status: DC | PRN
  Administered 2017-01-28: 08:00:00

## 2017-01-28 MED ORDER — MIDAZOLAM HCL 5 MG/5ML IJ SOLN
INTRAMUSCULAR | Status: DC
Start: 2017-01-28 — End: 2017-01-28
  Filled 2017-01-28: qty 10

## 2017-01-28 MED ORDER — MEPERIDINE HCL 50 MG/ML IJ SOLN
INTRAMUSCULAR | Status: AC
  Filled 2017-01-28: qty 1

## 2017-01-28 NOTE — H&P (Signed)
Gabriela [name] is an 56 y.o. female.   Chief Complaint: Patient is here for colonoscopy. HPI: Patient is 56 year old Caucasian female with history of colonic adenomas and is here for surveillance colonoscopy. She denies rectal bleeding or recent change in her bowel habits. She generally has to 3 bowel movements per day. She has occasional diarrhea but no the patient. Last colonoscopy was in 2010 with removal of 2 small polyps and one was tubular adenoma. Family history is negative for CRC. Mother died of ovarian cancer in her 46s and father of metastatic prostate cancer Ms. 1s.  Past Medical History:  Diagnosis Date  . Anxiety   . Arthritis   . COPD (chronic obstructive pulmonary disease) ([place])   . Dyspnea   . Endometriosis   . GERD (gastroesophageal reflux disease)   . Headache   . High cholesterol   . Seizures ([place])    8 years ago    Past Surgical History:  Procedure Laterality Date  . BACK SURGERY  09/24/2016  . CHOLECYSTECTOMY    . cyst removed from ovaries    . DILATION AND CURETTAGE OF UTERUS    . Hysterescopy      History reviewed. No pertinent family history. Social History:  reports that she has been smoking Cigarettes.  She has a 35.00 pack-year smoking history. She has never used smokeless tobacco. She reports that she does not drink alcohol or use drugs.  Allergies:  Allergies  Allergen Reactions  . Codeine Nausea And Vomiting    Medications Prior to Admission  Medication Sig Dispense Refill  . acetaminophen (TYLENOL) 500 MG tablet Take 500 mg by mouth 2 (two) times daily as needed for moderate pain or headache.    . ALPRAZolam (XANAX) 1 MG tablet Take 1 mg by mouth at bedtime.     Marland Kitchen ibuprofen (ADVIL,MOTRIN) 200 MG tablet Take 200 mg by mouth 2 (two) times daily as needed for headache or moderate pain.    Javier Docker Oil 350 MG CAPS Take 350 mg by mouth daily.    . Multiple Vitamin (MULTIVITAMIN WITH MINERALS) TABS tablet Take 1 tablet by mouth daily.    Marland Kitchen  omeprazole (PRILOSEC) 20 MG capsule Take 20 mg by mouth daily. May take an additional 20mg s as needed for acid reflux    . ranitidine (ZANTAC) 150 MG tablet Take 150 mg by mouth at bedtime.      No results found for this or any previous visit (from the past 48 hour(s)). No results found.  ROS  Blood pressure (!) 147/89, pulse 98, temperature 98.4 F (36.9 C), temperature source Oral, resp. rate 14, height 5\' 2"  (1.575 m), weight 138 lb (62.6 kg), SpO2 97 %. Physical Exam  Constitutional: She appears well-developed and well-nourished.  HENT:  Mouth/Throat: Oropharynx is clear and moist.  Eyes: Conjunctivae are normal. No scleral icterus.  Neck: No thyromegaly present.  Cardiovascular: Normal rate, regular rhythm and normal heart sounds.   No murmur heard. Respiratory: Effort normal and breath sounds normal.  GI: Soft. She exhibits no distension and no mass. There is no tenderness.  Musculoskeletal: She exhibits no edema.  Lymphadenopathy:    She has no cervical adenopathy.  Neurological: She is alert.  Skin: Skin is warm and dry.     Assessment/Plan History of colonic adenoma. Surveillance colonoscopy.  Hildred Laser, MD 01/28/2017, 7:29 AM

## 2017-01-28 NOTE — Op Note (Signed)
[facility] Patient Name: Gabriela [name] Procedure Date: 01/28/2017 7:17 AM MRN: [id number] Date of Birth: 07/01/1961 Attending MD: Hildred Laser , MD CSN: [id number] Age: 56 Admit Type: Outpatient Procedure:                Colonoscopy Indications:              High risk colon cancer surveillance: Personal                            history of colonic polyps Providers:                Hildred Laser, MD, Otis Peak B. Sharon Seller, RN, Zoila Shutter, Technologist Referring MD:             Jasper Loser. Luan Pulling, MD Medicines:                Meperidine 50 mg IV, Midazolam 10 mg IV Complications:            No immediate complications. Estimated Blood Loss:     Estimated blood loss was minimal. Procedure:                Pre-Anesthesia Assessment:                           - Prior to the procedure, a History and Physical                            was performed, and patient medications and                            allergies were reviewed. The patient's tolerance of                            previous anesthesia was also reviewed. The risks                            and benefits of the procedure and the sedation                            options and risks were discussed with the patient.                            All questions were answered, and informed consent                            was obtained. Prior Anticoagulants: The patient                            last took ibuprofen 7 days prior to the procedure.                            ASA Grade Assessment: II - A patient with mild  systemic disease. After reviewing the risks and                            benefits, the patient was deemed in satisfactory                            condition to undergo the procedure.                           After obtaining informed consent, the colonoscope                            was passed under direct vision. Throughout the                             procedure, the patient's blood pressure, pulse, and                            oxygen saturations were monitored continuously. The                            [id number] ([id number]) scope was introduced through                            the anus and advanced to the the cecum, identified                            by appendiceal orifice and ileocecal valve. The                            colonoscopy was performed without difficulty. The                            patient tolerated the procedure well. The quality                            of the bowel preparation was excellent. The                            ileocecal valve, appendiceal orifice, and rectum                            were photographed. Scope In: 7:42:54 AM Scope Out: 7:58:16 AM Scope Withdrawal Time: 0 hours 8 minutes 38 seconds  Total Procedure Duration: 0 hours 15 minutes 22 seconds  Findings:      The perianal and digital rectal examinations were normal.      A 5 mm polyp was found in the proximal ascending colon. The polyp was       sessile. The polyp was removed with a cold snare. Resection and       retrieval were complete.      The exam was otherwise normal throughout the examined colon.      External hemorrhoids were found during retroflexion. The hemorrhoids       were small.  Anal papilla(e) were hypertrophied. Impression:               - One 5 mm polyp in the proximal ascending colon,                            removed with a cold snare. Resected and retrieved.                           - External hemorrhoids.                           - Anal papilla(e) were hypertrophied. Moderate Sedation:      Moderate (conscious) sedation was administered by the endoscopy nurse       and supervised by the endoscopist. The following parameters were       monitored: oxygen saturation, heart rate, blood pressure, CO2       capnography and response to care. Total physician intraservice time was       24  minutes. Recommendation:           - Resume previous diet today.                           - Continue present medications.                           - Await pathology results.                           - Repeat colonoscopy date to be determined after                            pending pathology results are reviewed.                           - Patient has a contact number available for                            emergencies. The signs and symptoms of potential                            delayed complications were discussed with the                            patient. Return to normal activities tomorrow.                            Written discharge instructions were provided to the                            patient. Procedure Code(s):        --- Professional ---                           [phone], Colonoscopy, flexible; with removal of  tumor(s), polyp(s), or other lesion(s) by snare                            technique                           99152, Moderate sedation services provided by the                            same physician or other qualified health care                            professional performing the diagnostic or                            therapeutic service that the sedation supports,                            requiring the presence of an independent trained                            observer to assist in the monitoring of the                            patient's level of consciousness and physiological                            status; initial 15 minutes of intraservice time,                            patient age 40 years or older                           [phone], Moderate sedation services; each additional                            15 minutes intraservice time Diagnosis Code(s):        --- Professional ---                           Z86.010, Personal history of colonic polyps                           D12.2, Benign neoplasm of ascending  colon                           K62.89, Other specified diseases of anus and rectum                           K64.4, Residual hemorrhoidal skin tags CPT copyright 2016 American [employer] Association. All rights reserved. The codes documented in this report are preliminary and upon coder review may  be revised to meet current compliance requirements. Hildred Laser, MD Hildred Laser, MD 01/28/2017 8:07:25 AM This report has been signed electronically. Number of Addenda: 0

## 2017-01-28 NOTE — Discharge Instructions (Signed)
Resume usual medications and diet. No driving for 24 hours. Physician will call with biopsy results.   Colonoscopy, Adult, Care After This sheet gives you information about how to care for yourself after your procedure. Your health care provider may also give you more specific instructions. If you have problems or questions, contact your health care provider. What can I expect after the procedure? After the procedure, it is common to have:  A small amount of blood in your stool for 24 hours after the procedure.  Some gas.  Mild abdominal cramping or bloating. Follow these instructions at home: General instructions    For the first 24 hours after the procedure:  Do not drive or use machinery.  Do not sign important documents.  Do not drink alcohol.  Do your regular daily activities at a slower pace than normal.  Eat soft, easy-to-digest foods.  Rest often.  Take over-the-counter or prescription medicines only as told by your health care provider.  It is up to you to get the results of your procedure. Ask your health care provider, or the department performing the procedure, when your results will be ready. Relieving cramping and bloating   Try walking around when you have cramps or feel bloated.  Apply heat to your abdomen as told by your health care provider. Use a heat source that your health care provider recommends, such as a moist heat pack or a heating pad.  Place a towel between your skin and the heat source.  Leave the heat on for 20-30 minutes.  Remove the heat if your skin turns bright red. This is especially important if you are unable to feel pain, heat, or cold. You may have a greater risk of getting burned. Eating and drinking   Drink enough fluid to keep your urine clear or pale yellow.  Resume your normal diet as instructed by your health care provider. Avoid heavy or fried foods that are hard to digest.  Avoid drinking alcohol for as long as  instructed by your health care provider. Contact a health care provider if:  You have blood in your stool 2-3 days after the procedure. Get help right away if:  You have more than a small spotting of blood in your stool.  You pass large blood clots in your stool.  Your abdomen is swollen.  You have nausea or vomiting.  You have a fever.  You have increasing abdominal pain that is not relieved with medicine.   Colon Polyps Polyps are tissue growths inside the body. Polyps can grow in many places, including the large intestine (colon). A polyp may be a round bump or a mushroom-shaped growth. You could have one polyp or several. Most colon polyps are noncancerous (benign). However, some colon polyps can become cancerous over time. What are the causes? The exact cause of colon polyps is not known. What increases the risk? This condition is more likely to develop in people who:  Have a family history of colon cancer or colon polyps.  Are older than 76 or older than 45 if they are African American.  Have inflammatory bowel disease, such as ulcerative colitis or Crohn disease.  Are overweight.  Smoke cigarettes.  Do not get enough exercise.  Drink too much alcohol.  Eat a diet that is:  High in fat and red meat.  Low in fiber.  Had childhood cancer that was treated with abdominal radiation. What are the signs or symptoms? Most polyps do not cause symptoms. If  you have symptoms, they may include:  Blood coming from your rectum when having a bowel movement.  Blood in your stool.The stool may look dark red or black.  A change in bowel habits, such as constipation or diarrhea. How is this diagnosed? This condition is diagnosed with a colonoscopy. This is a procedure that uses a lighted, flexible scope to look at the inside of your colon. How is this treated? Treatment for this condition involves removing any polyps that are found. Those polyps will then be tested for  cancer. If cancer is found, your health care provider will talk to you about options for colon cancer treatment. Follow these instructions at home: Diet   Eat plenty of fiber, such as fruits, vegetables, and whole grains.  Eat foods that are high in calcium and vitamin D, such as milk, cheese, yogurt, eggs, liver, fish, and broccoli.  Limit foods high in fat, red meats, and processed meats, such as hot dogs, sausage, bacon, and lunch meats.  Maintain a healthy weight, or lose weight if recommended by your health care provider. General instructions   Do not smoke cigarettes.  Do not drink alcohol excessively.  Keep all follow-up visits as told by your health care provider. This is important. This includes keeping regularly scheduled colonoscopies. Talk to your health care provider about when you need a colonoscopy.  Exercise every day or as told by your health care provider. Contact a health care provider if:  You have new or worsening bleeding during a bowel movement.  You have new or increased blood in your stool.  You have a change in bowel habits.  You unexpectedly lose weight. This information is not intended to replace advice given to you by your health care provider. Make sure you discuss any questions you have with your health care provider.

## 2017-01-30 ENCOUNTER — Encounter (HOSPITAL_COMMUNITY): Payer: Self-pay | Admitting: Internal Medicine

## 2017-02-17 DIAGNOSIS — M48062 Spinal stenosis, lumbar region with neurogenic claudication: Secondary | ICD-10-CM | POA: Diagnosis not present

## 2017-06-17 ENCOUNTER — Ambulatory Visit (INDEPENDENT_AMBULATORY_CARE_PROVIDER_SITE_OTHER)
Admission: RE | Admit: 2017-06-17 | Discharge: 2017-06-17 | Disposition: A | Discharge status: Home / Self Care | Payer: BLUE CROSS/BLUE SHIELD | Source: Ambulatory Visit | Attending: Acute Care | Admitting: Acute Care

## 2017-06-17 DIAGNOSIS — F17210 Nicotine dependence, cigarettes, uncomplicated: Secondary | ICD-10-CM

## 2017-06-17 DIAGNOSIS — Z87891 Personal history of nicotine dependence: Secondary | ICD-10-CM | POA: Diagnosis not present

## 2017-06-22 ENCOUNTER — Telehealth: Payer: Self-pay | Admitting: Internal Medicine

## 2017-06-22 NOTE — Telephone Encounter (Signed)
Received records from [facility] for appointment on 06/23/17 with Dr Debara Pickett.  Records put with Dr Lysbeth Penner schedule for 06/23/17.

## 2017-06-23 ENCOUNTER — Ambulatory Visit (INDEPENDENT_AMBULATORY_CARE_PROVIDER_SITE_OTHER): Payer: BLUE CROSS/BLUE SHIELD | Admitting: Internal Medicine

## 2017-06-23 ENCOUNTER — Other Ambulatory Visit: Payer: Self-pay | Admitting: Acute Care

## 2017-06-23 ENCOUNTER — Encounter: Payer: Self-pay | Admitting: Internal Medicine

## 2017-06-23 VITALS — BP 120/86 | HR 75 | Ht 62.0 in | Wt 140.6 lb

## 2017-06-23 DIAGNOSIS — I2584 Coronary atherosclerosis due to calcified coronary lesion: Secondary | ICD-10-CM

## 2017-06-23 DIAGNOSIS — I2510 Atherosclerotic heart disease of native coronary artery without angina pectoris: Secondary | ICD-10-CM | POA: Diagnosis not present

## 2017-06-23 DIAGNOSIS — F17200 Nicotine dependence, unspecified, uncomplicated: Secondary | ICD-10-CM

## 2017-06-23 DIAGNOSIS — E785 Hyperlipidemia, unspecified: Secondary | ICD-10-CM

## 2017-06-23 DIAGNOSIS — Z122 Encounter for screening for malignant neoplasm of respiratory organs: Secondary | ICD-10-CM

## 2017-06-23 DIAGNOSIS — F17210 Nicotine dependence, cigarettes, uncomplicated: Secondary | ICD-10-CM

## 2017-06-23 NOTE — Patient Instructions (Signed)
Your physician recommends that you return for lab work  FASTING to check cholesterol   Your physician recommends that you schedule a follow-up appointment in: [place] with Dr. Debara Pickett

## 2017-06-24 ENCOUNTER — Encounter: Payer: Self-pay | Admitting: Internal Medicine

## 2017-06-24 DIAGNOSIS — I2584 Coronary atherosclerosis due to calcified coronary lesion: Principal | ICD-10-CM

## 2017-06-24 DIAGNOSIS — I2510 Atherosclerotic heart disease of native coronary artery without angina pectoris: Secondary | ICD-10-CM | POA: Diagnosis not present

## 2017-06-24 DIAGNOSIS — E785 Hyperlipidemia, unspecified: Secondary | ICD-10-CM | POA: Insufficient documentation

## 2017-06-24 DIAGNOSIS — F17200 Nicotine dependence, unspecified, uncomplicated: Secondary | ICD-10-CM | POA: Diagnosis not present

## 2017-06-24 NOTE — Progress Notes (Signed)
OFFICE CONSULT NOTE  Chief Complaint:  Aortic atherosclerosis  Primary Care Physician: Sinda Du, MD  HPI:  Gabriela [name] is a 56 y.o. female who is being seen today for the evaluation of aortic atherosclerosis at the request of Sinda Du, MD. Gabriela [name] was referred today for evaluation of aortic atherosclerosis and a small speck of coronary artery calcium in the right coronary artery. This is recently identified on screening CT scan of the lungs as she is an ongoing smoker to evaluate for pulmonary nodules. Past medical history also includes dyslipidemia but is untreated, COPD, ongoing tobacco use and a history of seizures. She was referred by her gynecologist who has been ordering her CT scans. She reports being asymptomatic and denies any chest pain or worsening shortness of breath, beyond what is expected for her is a smoker. Blood pressure is well-controlled today 120/86. Her BMI is appropriate. Lipid profile some time. Family history significant for cancer in both parents but her grandmother did have a stroke. She is currently half pack per day smoker to smoke for about 35 years. She says she is interested in quitting and has Chantix at home but has not been able to do that.  PMHx:  Past Medical History:  Diagnosis Date  . Anxiety   . Arthritis   . COPD (chronic obstructive pulmonary disease) ([place])   . Dyspnea   . Endometriosis   . GERD (gastroesophageal reflux disease)   . Headache   . High cholesterol   . Seizures ([place])    8 years ago    Past Surgical History:  Procedure Laterality Date  . BACK SURGERY  09/24/2016  . CHOLECYSTECTOMY    . COLONOSCOPY N/A 01/28/2017   Procedure: COLONOSCOPY;  Surgeon: Rogene Houston, MD;  [facility];  Service: Endoscopy;  Laterality: N/A;  930  . cyst removed from ovaries    . DILATION AND CURETTAGE OF UTERUS    . Hysterescopy    . POLYPECTOMY  01/28/2017   Procedure: POLYPECTOMY;  Surgeon: Rogene Houston, MD;   [facility];  Service: Endoscopy;;  colon    FAMHx:  Family History  Problem Relation Age of Onset  . Cancer Mother   . Cancer Father   . Stroke Maternal Grandmother     SOCHx:   reports that she has been smoking Cigarettes.  She has a 35.00 pack-year smoking history. She has never used smokeless tobacco. She reports that she does not drink alcohol or use drugs.  ALLERGIES:  Allergies  Allergen Reactions  . Codeine Nausea And Vomiting    ROS: Pertinent items noted in HPI and remainder of comprehensive ROS otherwise negative.  HOME MEDS: Current Outpatient Prescriptions on File Prior to Visit  Medication Sig Dispense Refill  . acetaminophen (TYLENOL) 500 MG tablet Take 500 mg by mouth 2 (two) times daily as needed for moderate pain or headache.    . ALPRAZolam (XANAX) 1 MG tablet Take 1 mg by mouth at bedtime.     Marland Kitchen ibuprofen (ADVIL,MOTRIN) 200 MG tablet Take 200 mg by mouth 2 (two) times daily as needed for headache or moderate pain.    Javier Docker Oil 350 MG CAPS Take 350 mg by mouth daily.    . Multiple Vitamin (MULTIVITAMIN WITH MINERALS) TABS tablet Take 1 tablet by mouth daily.    Marland Kitchen omeprazole (PRILOSEC) 20 MG capsule Take 20 mg by mouth daily. May take an additional 20mg s as needed for acid reflux    .  ranitidine (ZANTAC) 150 MG tablet Take 150 mg by mouth at bedtime.     No current facility-administered medications on file prior to visit.     LABS/IMAGING: CLINICAL DATA:  Thirty-six pack-year smoking history.  Asymptomatic.  EXAM: CT CHEST WITHOUT CONTRAST LOW-DOSE FOR LUNG CANCER SCREENING  TECHNIQUE: Multidetector CT imaging of the chest was performed following the standard protocol without IV contrast.  COMPARISON:  06/16/2016  FINDINGS: Cardiovascular: Aortic atherosclerosis. Normal heart size, without pericardial effusion. Right coronary artery atherosclerosis.  Mediastinum/Nodes: No mediastinal or definite hilar adenopathy, given  limitations of unenhanced CT.  Lungs/Pleura: No pleural fluid. Tiny right-sided pulmonary nodules are not significantly changed. At least 1 of these is calcified. No highly suspicious or enlarging nodules identified.  Upper Abdomen: Cholecystectomy. Normal imaged portions of the liver, spleen, stomach, pancreas, adrenal glands, kidneys.  Musculoskeletal: No acute osseous abnormality.  IMPRESSION: 1. Lung-RADS 2, benign appearance or behavior. Continue annual screening with low-dose chest CT without contrast in 12 months. 2.  Aortic Atherosclerosis (ICD10-I70.0).   Electronically Signed   By: Abigail Miyamoto M.D.   On: 06/17/2017 15:23   Scans on Order [id number]   Scan on 06/17/2017 1:07 PM by Perfecto Kingdom L : LCS formScan on 06/17/2017 1:07 PM by Fonda Kinder : LCS form  Result Notes   Notes recorded by Christie Beckers, RN on 06/23/2017 at 9:40 AM EDT Pt informed of CT results per Eric Form, NP. PT verbalized understanding. Copy sent to PCP. Order placed for 1 yr f/u CT.  ------  Notes recorded by Christie Beckers, RN on 06/23/2017 at 9:12 AM EDT [facility] x1 ------  Notes recorded by Magdalen Spatz, NP on 06/19/2017 at 3:30 PM EDT Please call patient and let her know her low-dose CT scan was read as a lung RADS 2. Lung RADS 2: nodules that are benign in appearance and behavior with a very low likelihood of becoming a clinically active cancer due to size or lack of growth. Recommendation per radiology is for a repeat LDCT in 12 months. Please let her know we will order and schedule her follow-up annual screening for August 2019. Please let her know there was notation of aortic atherosclerosis on the scan. She is not currently on any statin therapy. Please remind the patient that this is a non-gated exam therefore degree or severity of disease cannot be determined. Please have him follow up with his PCP regarding potential risk factor modification, dietary therapy or  pharmacologic therapy if clinically indicated. Please place order for annual screening scan for August 2019, and fax results to PCP. Thank so much    LIPID PANEL: No results found for: CHOL, TRIG, HDL, CHOLHDL, VLDL, LDLCALC, LDLDIRECT  WEIGHTS: Wt Readings from Last 3 Encounters:  06/23/17 140 lb 9.6 oz (63.8 kg)  01/28/17 138 lb (62.6 kg)  01/26/12 139 lb 6.4 oz (63.2 kg)    VITALS: BP 120/86   Pulse 75   Ht 5\' 2"  (1.575 m)   Wt 140 lb 9.6 oz (63.8 kg)   BMI 25.72 kg/m   EXAM: General appearance: alert and no distress Neck: no carotid bruit, no JVD and thyroid not enlarged, symmetric, no tenderness/mass/nodules Lungs: clear to auscultation bilaterally Heart: regular rate and rhythm Abdomen: soft, non-tender; bowel sounds normal; no masses,  no organomegaly Extremities: extremities normal, atraumatic, no cyanosis or edema Pulses: 2+ and symmetric Skin: Skin color, texture, turgor normal. No rashes or lesions Neurologic: Grossly normal Psych: Pleasant  EKG: Normal  sinus rhythm, low-voltage QRS at 75 - personally reviewed  ASSESSMENT: 1. Aortic and right coronary artery atherosclerosis, asymptomatic 2. Ongoing tobacco abuse 3. COPD 4. History of dyslipidemia-not on therapy  PLAN: 1.   Gabriela [name] has signs of aortic atherosclerosis and some coronary artery calcification. She is asymptomatic and will not need stress testing however aggressive risk factor modification is recommended. Given her atherosclerosis and coronary artery calcification, her goal LDL-C should be less than 70. She will need to stop smoking immediately. She says she has Chantix and I reinforced ways to help her stop quitting as well as providing the [place] quit line. She should set a quit date. In addition will check a lipid profile and most likely recommend starting statin therapy. We also talked about dietary changes she can make to lower her saturated fats, increase omega-3s and generally improve  her nutrition.    Thanks so much for the consultation.  Follow-up with me in 3 months.   [name], MD, [place]  Attending Cardiologist  Direct Dial: [phone]  Fax: [phone]  Website:  [email or web address] 06/24/2017, 10:14 AM

## 2017-06-25 LAB — LIPID PANEL
Chol/HDL Ratio: 4.5 ratio — ABNORMAL HIGH (ref 0.0–4.4)
Cholesterol, Total: 220 mg/dL — ABNORMAL HIGH (ref 100–199)
HDL: 49 mg/dL (ref >39)
LDL Calculated: 146 mg/dL — ABNORMAL HIGH (ref 0–99)
Triglycerides: 127 mg/dL (ref 0–149)
VLDL CHOLESTEROL CAL: 25 mg/dL (ref 5–40)

## 2017-07-01 ENCOUNTER — Encounter: Payer: Self-pay | Admitting: Internal Medicine

## 2017-07-01 ENCOUNTER — Telehealth: Payer: Self-pay | Admitting: Internal Medicine

## 2017-07-01 DIAGNOSIS — E785 Hyperlipidemia, unspecified: Secondary | ICD-10-CM

## 2017-07-01 MED ORDER — ROSUVASTATIN CALCIUM 40 MG PO TABS: 40.0000 mg | ORAL_TABLET | Freq: Every day | ORAL | 5 refills | Status: DC

## 2017-07-01 NOTE — Telephone Encounter (Signed)
Notes recorded by [name], MD on 06/28/2017 at 3:34 PM EDT Elevated lipid profile. Needs >50% reduction in LDL. Recommend starting Crestor 40 mg daily. Repeat lipids in 3 months.  Dr. Lemmie Evens __________________ Patient called w/results. Agrees w/MD plan. Rx(s) sent to pharmacy electronically. Lab order + copy of lab results mailed to patient. Advised patient to have lab work done a few days before her 09/25/17 appt with MD

## 2017-07-24 IMAGING — CT CT NECK W/ CM
3 of 4 series · 13 of 33 positions shown, 16 images · IV contrast (iopamidol)
Comparison: Head CT without contrast 06/19/2006.

CLINICAL DATA: 55-year-old female with bilateral neck pain and
swelling. Chronic sore throat. Initial encounter.

EXAM:
CT NECK WITH CONTRAST
TECHNIQUE: Multidetector CT imaging of the neck was performed using the
standard protocol following the bolus administration of intravenous
contrast.
CONTRAST:  75mL [id number] IOPAMIDOL (FEJV5T-A33) INJECTION 61%

[Series 4: sag neck · sagittal · 0.45mm/px · 5 of 101 slices shown, 6 images]
[im 34/101  bone]
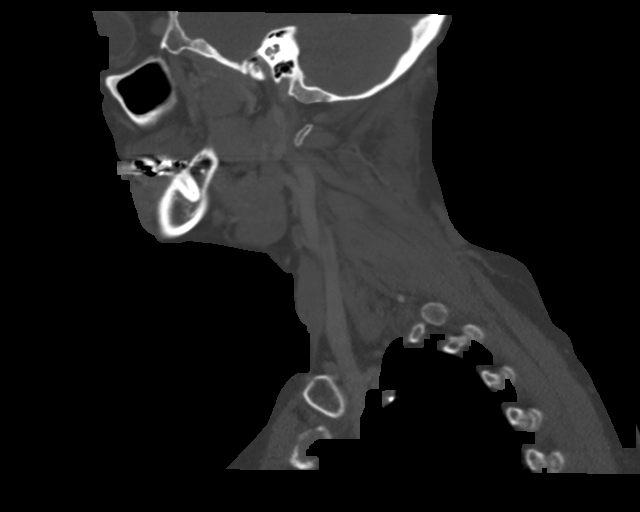
[im 42/101  bone]
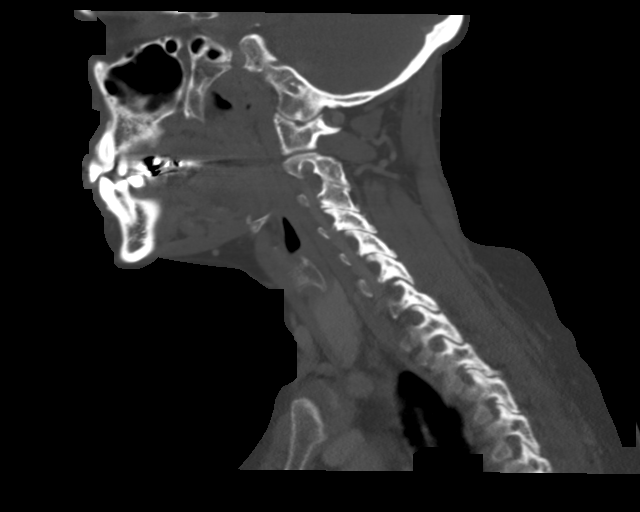
[im 51/101  soft-tissue]
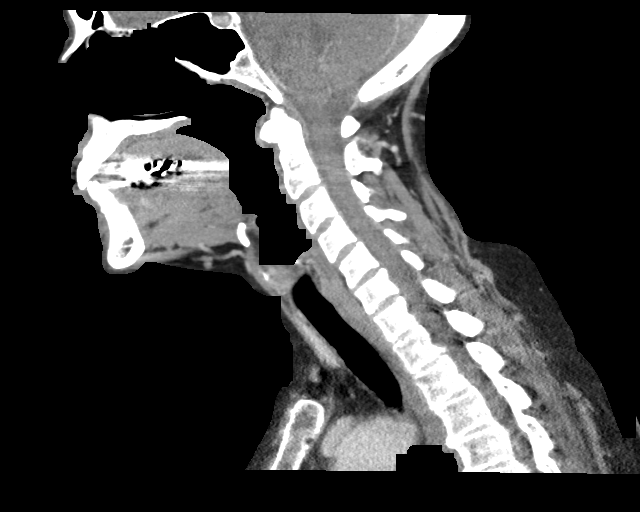
[im 51/101  bone]
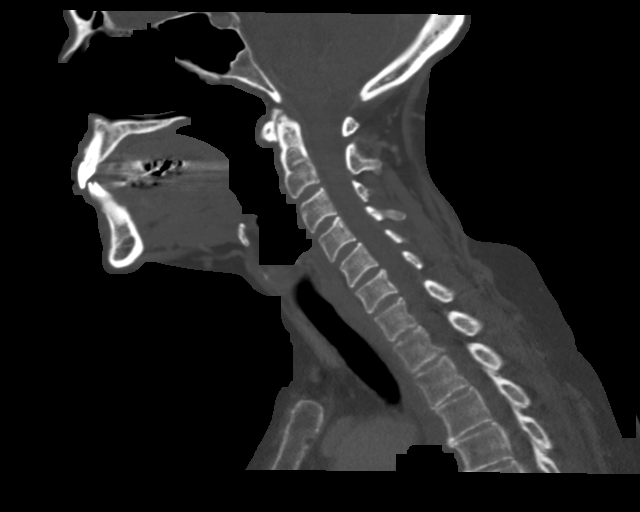
[im 59/101  bone]
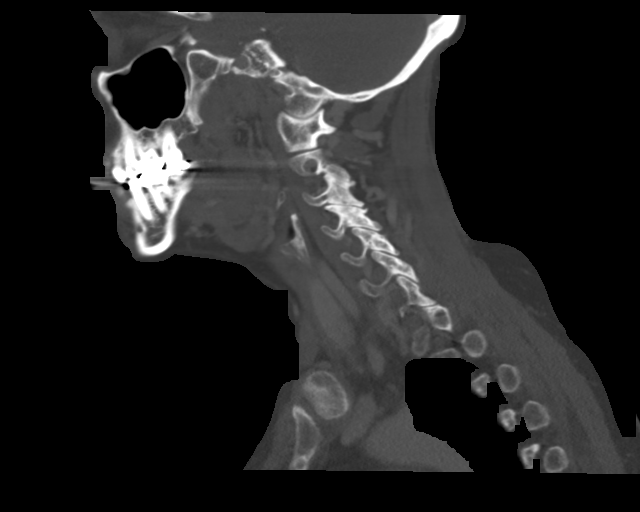
[im 67/101  bone]
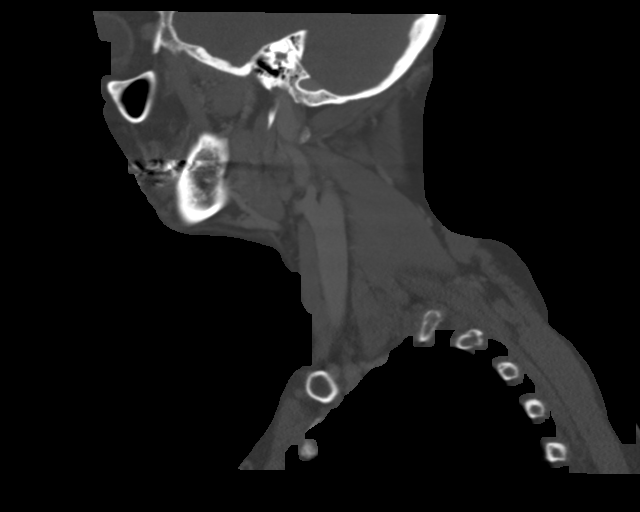

[Series 5: cor neck · coronal · 0.39mm/px · 3 of 140 slices shown]
[im 36/140  bone]
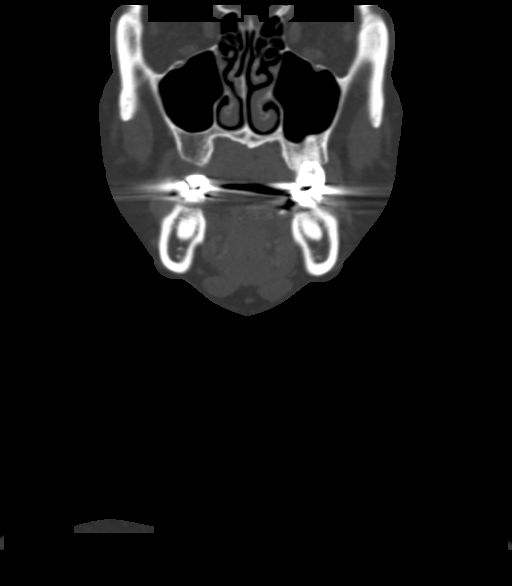
[im 59/140  bone]
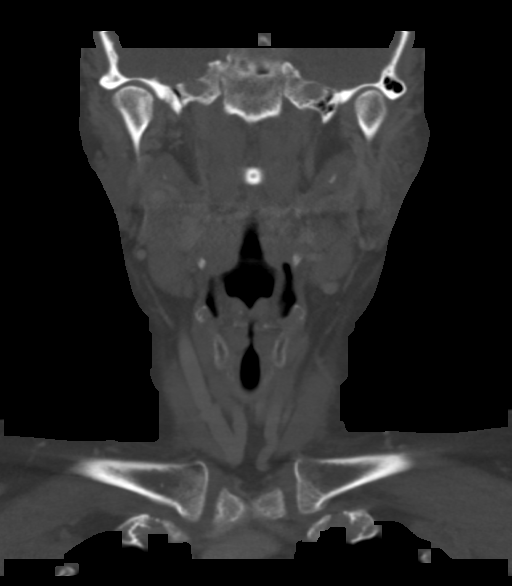
[im 82/140  bone]
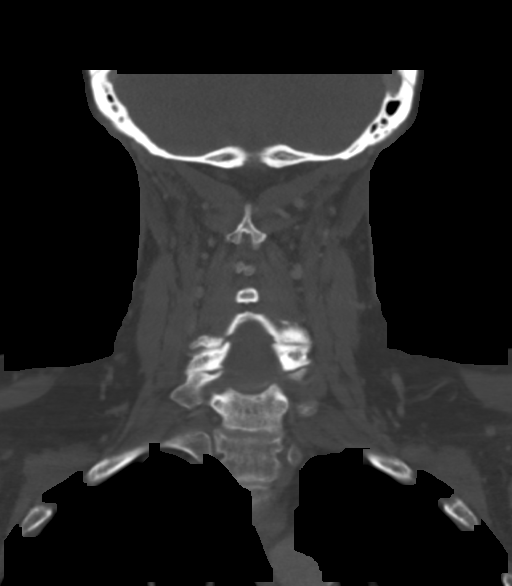

[Series 6: orthogonal ax · axial · 0.39mm/px · z∈[+42,+204]mm · 5 of 127 slices shown, 7 images]
[im 19/127  soft-tissue]
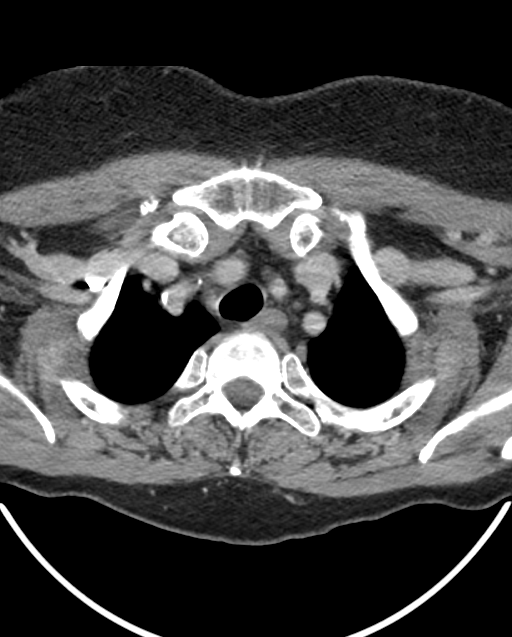
[im 19/127  bone]
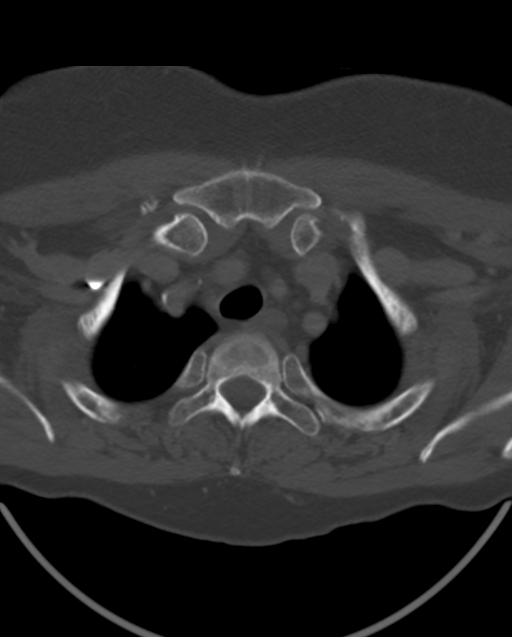
[im 37/127  bone]
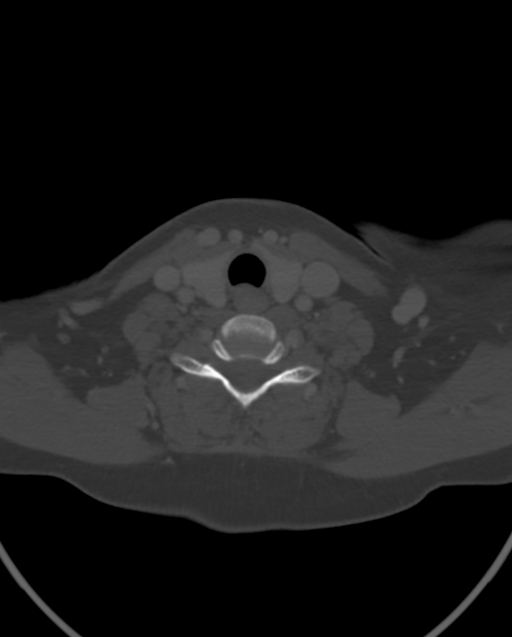
[im 73/127  bone]
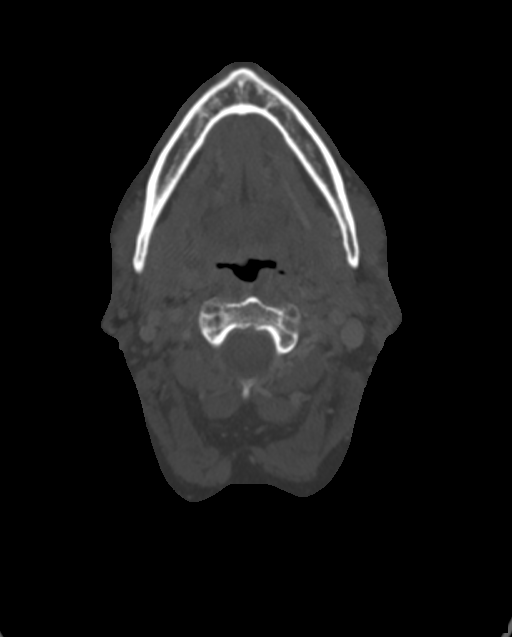
[im 91/127  bone]
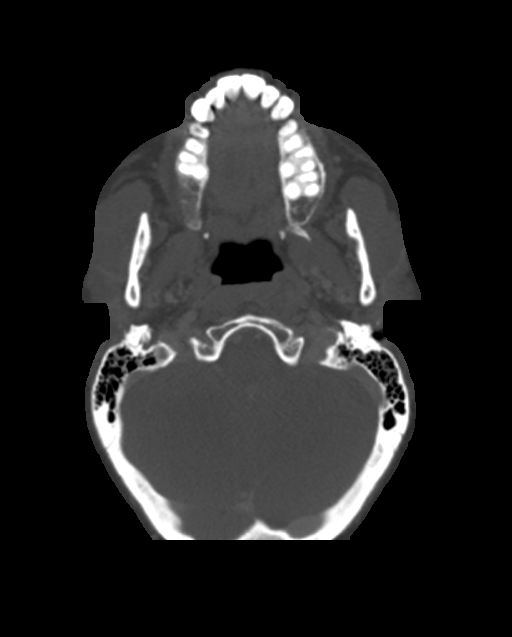
[im 109/127  soft-tissue]
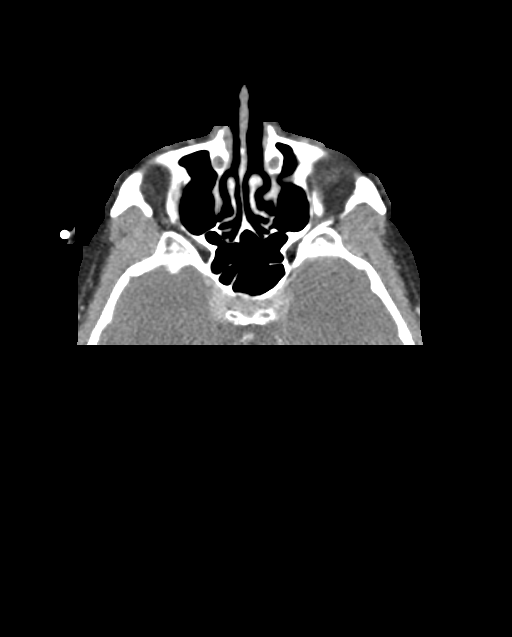
[im 109/127  bone]
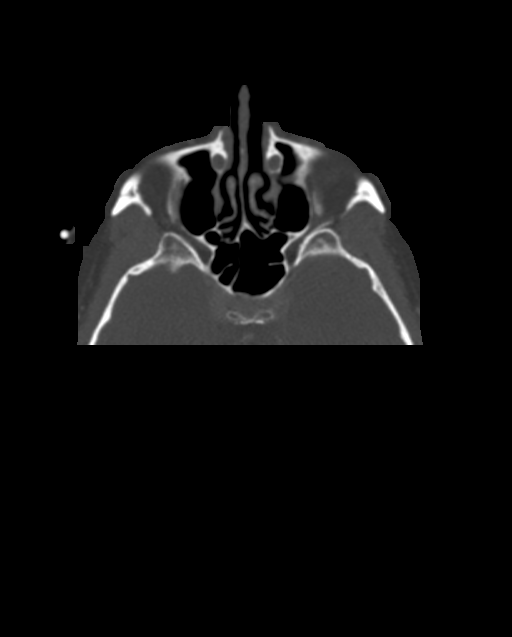

[13 of 33 positions shown; findings below may reference images not displayed]

FINDINGS: Pharynx and larynx: Negative larynx. Pharyngeal soft tissue contours
are within normal limits. Negative parapharyngeal and
retropharyngeal spaces.

Salivary glands: Negative sublingual space, submandibular glands and
parotid glands.

Thyroid: Negative.

Lymph nodes: No cervical lymphadenopathy. Symmetric normal appearing
bilateral cervical lymph nodes.

Vascular: Major vascular structures in the neck and at the skullbase
are patent. Mild carotid bifurcation atherosclerosis.

Limited intracranial: Negative.

Visualized orbits: Negative.

Mastoids and visualized paranasal sinuses: Visualized paranasal
sinuses and mastoids are stable and well pneumatized.

Skeleton: Mild to moderate widespread cervical facet degeneration.
No acute osseous abnormality identified.

Upper chest: Negative lung apices. Negative visualized superior
mediastinum. Normal visible axillary lymph nodes.
IMPRESSION: Negative CT appearance of the neck.

## 2017-09-07 ENCOUNTER — Telehealth: Payer: Self-pay | Admitting: Internal Medicine

## 2017-09-07 NOTE — Telephone Encounter (Signed)
New message  Pt verbalized that the 08/10/2017 she stopped taking her medication Rosugastatin 40mg   and she want to know if Dr.Hilty want to start her on something else   Before coming to the appt or just come to the appt

## 2017-09-07 NOTE — Telephone Encounter (Signed)
I would advise not changing any other medications and see if her symptoms resolve over the next 2 weeks and we can discuss alternatives at the follow-up office visit.  Dr. Lemmie Evens

## 2017-09-07 NOTE — Telephone Encounter (Signed)
Patient has been made aware of Dr. Lysbeth Penner recommendation and verbalized her understanding.

## 2017-09-07 NOTE — Telephone Encounter (Signed)
Returned the call to the patient. She stated that she discontinued the Rosuvastatin on 10/19 due to bad muscle cramps. She would like to know if she can try a different medication. The patient has an appointment with Dr. Debara Pickett on 11/30. Will route to the provider for his recommendation.

## 2017-09-21 DIAGNOSIS — E785 Hyperlipidemia, unspecified: Secondary | ICD-10-CM | POA: Diagnosis not present

## 2017-09-22 LAB — LIPID PANEL
Chol/HDL Ratio: 4.2 ratio (ref 0.0–4.4)
Cholesterol, Total: 234 mg/dL — ABNORMAL HIGH (ref 100–199)
HDL: 56 mg/dL (ref >39)
LDL Calculated: 152 mg/dL — ABNORMAL HIGH (ref 0–99)
Triglycerides: 132 mg/dL (ref 0–149)
VLDL Cholesterol Cal: 26 mg/dL (ref 5–40)

## 2017-09-25 ENCOUNTER — Encounter: Payer: Self-pay | Admitting: Internal Medicine

## 2017-09-25 ENCOUNTER — Ambulatory Visit (INDEPENDENT_AMBULATORY_CARE_PROVIDER_SITE_OTHER): Payer: BLUE CROSS/BLUE SHIELD | Admitting: Internal Medicine

## 2017-09-25 VITALS — BP 124/70 | HR 67 | Ht 62.0 in | Wt 139.0 lb

## 2017-09-25 DIAGNOSIS — F17200 Nicotine dependence, unspecified, uncomplicated: Secondary | ICD-10-CM

## 2017-09-25 DIAGNOSIS — I2584 Coronary atherosclerosis due to calcified coronary lesion: Secondary | ICD-10-CM | POA: Diagnosis not present

## 2017-09-25 DIAGNOSIS — E785 Hyperlipidemia, unspecified: Secondary | ICD-10-CM

## 2017-09-25 DIAGNOSIS — I2510 Atherosclerotic heart disease of native coronary artery without angina pectoris: Secondary | ICD-10-CM | POA: Diagnosis not present

## 2017-09-25 MED ORDER — ATORVASTATIN CALCIUM 40 MG PO TABS: 40.0000 mg | ORAL_TABLET | Freq: Every day | ORAL | 3 refills | Status: DC

## 2017-09-25 NOTE — Progress Notes (Signed)
OFFICE CONSULT NOTE  Chief Complaint:  Follow-up aortic atherosclerosis  Primary Care Physician: Sinda Du, MD  HPI:  Gabriela [name] is a 56 y.o. female who is being seen today for the evaluation of aortic atherosclerosis at the request of Sinda Du, MD. Gabriela [name] was referred today for evaluation of aortic atherosclerosis and a small speck of coronary artery calcium in the right coronary artery. This is recently identified on screening CT scan of the lungs as she is an ongoing smoker to evaluate for pulmonary nodules. Past medical history also includes dyslipidemia but is untreated, COPD, ongoing tobacco use and a history of seizures. She was referred by her gynecologist who has been ordering her CT scans. She reports being asymptomatic and denies any chest pain or worsening shortness of breath, beyond what is expected for her is a smoker. Blood pressure is well-controlled today 120/86. Her BMI is appropriate. Lipid profile some time. Family history significant for cancer in both parents but her grandmother did have a stroke. She is currently half pack per day smoker to smoke for about 35 years. She says she is interested in quitting and has Chantix at home but has not been able to do that.  09/25/2017  Gabriela [name] returns today for follow-up of her aortic atherosclerosis.  Based on her atherosclerosis and coronary calcification, her LDL goal is less than 70.  I recommended starting rosuvastatin 40 mg daily.  She says she took this for several weeks and developed significant myalgias.  It was therefore discontinued.  She did have a repeat lipid profile, but at the time I did not realize she had stopped the statin medication.  Her lipids are essentially unchanged.  We discussed the importance of management of her dyslipidemia and she is willing to try another statin.  PMHx:  Past Medical History:  Diagnosis Date  . Anxiety   . Arthritis   . COPD (chronic obstructive pulmonary  disease) ([place])   . Dyspnea   . Endometriosis   . GERD (gastroesophageal reflux disease)   . Headache   . High cholesterol   . Seizures ([place])    8 years ago    Past Surgical History:  Procedure Laterality Date  . BACK SURGERY  09/24/2016  . CHOLECYSTECTOMY    . COLONOSCOPY N/A 01/28/2017   Procedure: COLONOSCOPY;  Surgeon: Rogene Houston, MD;  [facility];  Service: Endoscopy;  Laterality: N/A;  930  . cyst removed from ovaries    . DILATION AND CURETTAGE OF UTERUS    . Hysterescopy    . POLYPECTOMY  01/28/2017   Procedure: POLYPECTOMY;  Surgeon: Rogene Houston, MD;  [facility];  Service: Endoscopy;;  colon    FAMHx:  Family History  Problem Relation Age of Onset  . Cancer Mother   . Cancer Father   . Stroke Maternal Grandmother     SOCHx:   reports that she quit smoking about 2 months ago. Her smoking use included cigarettes. She has a 35.00 pack-year smoking history. she has never used smokeless tobacco. She reports that she does not drink alcohol or use drugs.  ALLERGIES:  Allergies  Allergen Reactions  . Codeine Nausea And Vomiting    ROS: Pertinent items noted in HPI and remainder of comprehensive ROS otherwise negative.  HOME MEDS: Current Outpatient Medications on File Prior to Visit  Medication Sig Dispense Refill  . acetaminophen (TYLENOL) 500 MG tablet Take 500 mg by mouth 2 (two) times daily  as needed for moderate pain or headache.    . ALPRAZolam (XANAX) 1 MG tablet Take 1 mg by mouth at bedtime.     Marland Kitchen ibuprofen (ADVIL,MOTRIN) 200 MG tablet Take 200 mg by mouth 2 (two) times daily as needed for headache or moderate pain.    . Multiple Vitamin (MULTIVITAMIN WITH MINERALS) TABS tablet Take 1 tablet by mouth daily.    Marland Kitchen omeprazole (PRILOSEC) 20 MG capsule Take 20 mg by mouth daily. May take an additional 20mg s as needed for acid reflux    . ranitidine (ZANTAC) 150 MG tablet Take 150 mg by mouth at bedtime.    . rosuvastatin (CRESTOR)  40 MG tablet Take 1 tablet (40 mg total) by mouth daily. 30 tablet 5   No current facility-administered medications on file prior to visit.     LABS/IMAGING: CLINICAL DATA:  Thirty-six pack-year smoking history.  Asymptomatic.  EXAM: CT CHEST WITHOUT CONTRAST LOW-DOSE FOR LUNG CANCER SCREENING  TECHNIQUE: Multidetector CT imaging of the chest was performed following the standard protocol without IV contrast.  COMPARISON:  06/16/2016  FINDINGS: Cardiovascular: Aortic atherosclerosis. Normal heart size, without pericardial effusion. Right coronary artery atherosclerosis.  Mediastinum/Nodes: No mediastinal or definite hilar adenopathy, given limitations of unenhanced CT.  Lungs/Pleura: No pleural fluid. Tiny right-sided pulmonary nodules are not significantly changed. At least 1 of these is calcified. No highly suspicious or enlarging nodules identified.  Upper Abdomen: Cholecystectomy. Normal imaged portions of the liver, spleen, stomach, pancreas, adrenal glands, kidneys.  Musculoskeletal: No acute osseous abnormality.  IMPRESSION: 1. Lung-RADS 2, benign appearance or behavior. Continue annual screening with low-dose chest CT without contrast in 12 months. 2.  Aortic Atherosclerosis (ICD10-I70.0).   Electronically Signed   By: Abigail Miyamoto M.D.   On: 06/17/2017 15:23   Scans on Order [id number]   Scan on 06/17/2017 1:07 PM by Perfecto Kingdom L : LCS formScan on 06/17/2017 1:07 PM by Fonda Kinder : LCS form  Result Notes   Notes recorded by Christie Beckers, RN on 06/23/2017 at 9:40 AM EDT Pt informed of CT results per Eric Form, NP. PT verbalized understanding. Copy sent to PCP. Order placed for 1 yr f/u CT.  ------  Notes recorded by Christie Beckers, RN on 06/23/2017 at 9:12 AM EDT [facility] x1 ------  Notes recorded by Magdalen Spatz, NP on 06/19/2017 at 3:30 PM EDT Please call patient and let her know her low-dose CT scan was read as a lung  RADS 2. Lung RADS 2: nodules that are benign in appearance and behavior with a very low likelihood of becoming a clinically active cancer due to size or lack of growth. Recommendation per radiology is for a repeat LDCT in 12 months. Please let her know we will order and schedule her follow-up annual screening for August 2019. Please let her know there was notation of aortic atherosclerosis on the scan. She is not currently on any statin therapy. Please remind the patient that this is a non-gated exam therefore degree or severity of disease cannot be determined. Please have him follow up with his PCP regarding potential risk factor modification, dietary therapy or pharmacologic therapy if clinically indicated. Please place order for annual screening scan for August 2019, and fax results to PCP. Thank so much    LIPID PANEL:    Component Value Date/Time   CHOL 234 (H) 09/21/2017 0821   TRIG 132 09/21/2017 0821   HDL 56 09/21/2017 0821   CHOLHDL 4.2 09/21/2017 5462  LDLCALC 152 (H) 09/21/2017 5573    WEIGHTS: Wt Readings from Last 3 Encounters:  09/25/17 139 lb (63 kg)  06/23/17 140 lb 9.6 oz (63.8 kg)  01/28/17 138 lb (62.6 kg)    VITALS: BP 124/70   Pulse 67   Ht 5\' 2"  (1.575 m)   Wt 139 lb (63 kg)   SpO2 96%   BMI 25.42 kg/m   EXAM: General appearance: alert and no distress Lungs: clear to auscultation bilaterally Heart: regular rate and rhythm Extremities: extremities normal, atraumatic, no cyanosis or edema Neurologic: Grossly normal  EKG: Normal sinus rhythm at 67-personally reviewed  ASSESSMENT: 1. Aortic and right coronary artery atherosclerosis, asymptomatic 2. Ongoing tobacco abuse 3. COPD 4. History of dyslipidemia-not on therapy  PLAN: 1.   Mrs. Faso had intolerance to rosuvastatin 40 mg daily.  Will try her on atorvastatin 40 mg daily to see if that is better tolerated.  I advised her to take it for at least 2 weeks to see if symptoms will return.  If not  she should continue on this and we will repeat a lipid profile in 3 months.  Should she not tolerate this we need to consider PCSK9 therapy.  [name], MD, [facility], [place] Director of the [facility] Lipid [facility]  [facility] Attending Cardiologist  Direct Dial: [phone]  Fax: [phone]  Website:  [email or web address] 09/25/2017, 3:19 PM

## 2017-09-25 NOTE — Patient Instructions (Signed)
Your physician has recommended you make the following change in your medication:  -- STOP crestor -- START atorvastatin 40mg  once daily  Your physician recommends that you return for lab work in [place] (fasting)  Your physician wants you to follow-up in: [place] with Dr. Debara Pickett (after lab work) You will receive a reminder letter in the mail two months in advance. If you don't receive a letter, please call our office to schedule the follow-up appointment.

## 2017-09-28 ENCOUNTER — Encounter: Payer: Self-pay | Admitting: Internal Medicine

## 2017-11-30 DIAGNOSIS — M8588 Other specified disorders of bone density and structure, other site: Secondary | ICD-10-CM | POA: Diagnosis not present

## 2017-11-30 DIAGNOSIS — Z1231 Encounter for screening mammogram for malignant neoplasm of breast: Secondary | ICD-10-CM | POA: Diagnosis not present

## 2017-11-30 DIAGNOSIS — Z13820 Encounter for screening for osteoporosis: Secondary | ICD-10-CM | POA: Diagnosis not present

## 2017-11-30 DIAGNOSIS — Z01419 Encounter for gynecological examination (general) (routine) without abnormal findings: Secondary | ICD-10-CM | POA: Diagnosis not present

## 2017-11-30 DIAGNOSIS — Z6826 Body mass index (BMI) 26.0-26.9, adult: Secondary | ICD-10-CM | POA: Diagnosis not present

## 2017-12-29 ENCOUNTER — Ambulatory Visit: Payer: BLUE CROSS/BLUE SHIELD | Admitting: Internal Medicine

## 2018-01-29 ENCOUNTER — Other Ambulatory Visit (INDEPENDENT_AMBULATORY_CARE_PROVIDER_SITE_OTHER): Payer: Self-pay | Admitting: Otolaryngology

## 2018-02-04 ENCOUNTER — Other Ambulatory Visit (HOSPITAL_COMMUNITY): Payer: Self-pay | Admitting: Pulmonary Disease

## 2018-02-04 ENCOUNTER — Ambulatory Visit (HOSPITAL_COMMUNITY)
Admission: RE | Admit: 2018-02-04 | Discharge: 2018-02-04 | Disposition: A | Discharge status: Home / Self Care | Payer: BLUE CROSS/BLUE SHIELD | Source: Ambulatory Visit | Attending: Pulmonary Disease | Admitting: Pulmonary Disease

## 2018-02-04 DIAGNOSIS — M79644 Pain in right finger(s): Secondary | ICD-10-CM | POA: Diagnosis not present

## 2018-02-04 DIAGNOSIS — M25842 Other specified joint disorders, left hand: Secondary | ICD-10-CM | POA: Insufficient documentation

## 2018-02-04 DIAGNOSIS — M79645 Pain in left finger(s): Secondary | ICD-10-CM | POA: Insufficient documentation

## 2018-02-04 DIAGNOSIS — F419 Anxiety disorder, unspecified: Secondary | ICD-10-CM | POA: Diagnosis not present

## 2018-02-04 DIAGNOSIS — E785 Hyperlipidemia, unspecified: Secondary | ICD-10-CM | POA: Diagnosis not present

## 2018-02-04 DIAGNOSIS — M545 Low back pain: Secondary | ICD-10-CM | POA: Diagnosis not present

## 2018-02-05 ENCOUNTER — Other Ambulatory Visit (HOSPITAL_COMMUNITY): Payer: Self-pay | Admitting: Pulmonary Disease

## 2018-02-05 DIAGNOSIS — M79645 Pain in left finger(s): Secondary | ICD-10-CM

## 2018-02-15 DIAGNOSIS — M545 Low back pain: Secondary | ICD-10-CM | POA: Diagnosis not present

## 2018-02-15 DIAGNOSIS — E785 Hyperlipidemia, unspecified: Secondary | ICD-10-CM | POA: Diagnosis not present

## 2018-02-15 DIAGNOSIS — F419 Anxiety disorder, unspecified: Secondary | ICD-10-CM | POA: Diagnosis not present

## 2018-02-17 ENCOUNTER — Encounter

## 2018-02-17 ENCOUNTER — Ambulatory Visit (INDEPENDENT_AMBULATORY_CARE_PROVIDER_SITE_OTHER): Payer: BLUE CROSS/BLUE SHIELD | Admitting: Internal Medicine

## 2018-02-17 ENCOUNTER — Encounter: Payer: Self-pay | Admitting: Internal Medicine

## 2018-02-17 VITALS — BP 132/82 | HR 71 | Ht 62.0 in | Wt 142.4 lb

## 2018-02-17 DIAGNOSIS — I2510 Atherosclerotic heart disease of native coronary artery without angina pectoris: Secondary | ICD-10-CM | POA: Diagnosis not present

## 2018-02-17 DIAGNOSIS — F17200 Nicotine dependence, unspecified, uncomplicated: Secondary | ICD-10-CM | POA: Diagnosis not present

## 2018-02-17 DIAGNOSIS — E785 Hyperlipidemia, unspecified: Secondary | ICD-10-CM | POA: Diagnosis not present

## 2018-02-17 DIAGNOSIS — I2584 Coronary atherosclerosis due to calcified coronary lesion: Secondary | ICD-10-CM

## 2018-02-17 NOTE — Progress Notes (Signed)
OFFICE CONSULT NOTE  Chief Complaint:  Follow-up aortic atherosclerosis  Primary Care Physician: Sinda Du, MD  HPI:  Gabriela [name] is a 57 y.o. female who is being seen today for the evaluation of aortic atherosclerosis at the request of Sinda Du, MD. Gabriela [name] was referred today for evaluation of aortic atherosclerosis and a small speck of coronary artery calcium in the right coronary artery. This is recently identified on screening CT scan of the lungs as she is an ongoing smoker to evaluate for pulmonary nodules. Past medical history also includes dyslipidemia but is untreated, COPD, ongoing tobacco use and a history of seizures. She was referred by her gynecologist who has been ordering her CT scans. She reports being asymptomatic and denies any chest pain or worsening shortness of breath, beyond what is expected for her is a smoker. Blood pressure is well-controlled today 120/86. Her BMI is appropriate. Lipid profile some time. Family history significant for cancer in both parents but her grandmother did have a stroke. She is currently half pack per day smoker to smoke for about 35 years. She says she is interested in quitting and has Chantix at home but has not been able to do that.  09/25/2017  Gabriela [name] returns today for follow-up of her aortic atherosclerosis.  Based on her atherosclerosis and coronary calcification, her LDL goal is less than 70.  I recommended starting rosuvastatin 40 mg daily.  She says she took this for several weeks and developed significant myalgias.  It was therefore discontinued.  She did have a repeat lipid profile, but at the time I did not realize she had stopped the statin medication.  Her lipids are essentially unchanged.  We discussed the importance of management of her dyslipidemia and she is willing to try another statin.  02/17/2018  Gabriela [name] returns today for follow-up.  She said she had lab work drawn on Monday however those have not  yet resulted.  She is now on atorvastatin 40 mg but admits she generally takes it every other day.  She thinks is tolerable but has some achiness in her shoulder and her knees.  This is similar to symptoms she had in the past but in different areas.  I suspect this could be more of an arthralgia arthritis rather than myalgias.  I encouraged her to continue the atorvastatin.  We will certainly have to follow-up on the lab work as is not currently available.  PMHx:  Past Medical History:  Diagnosis Date  . Anxiety   . Arthritis   . COPD (chronic obstructive pulmonary disease) ([place])   . Dyspnea   . Endometriosis   . GERD (gastroesophageal reflux disease)   . Headache   . High cholesterol   . Seizures ([place])    8 years ago    Past Surgical History:  Procedure Laterality Date  . BACK SURGERY  09/24/2016  . CHOLECYSTECTOMY    . COLONOSCOPY N/A 01/28/2017   Procedure: COLONOSCOPY;  Surgeon: Rogene Houston, MD;  [facility];  Service: Endoscopy;  Laterality: N/A;  930  . cyst removed from ovaries    . DILATION AND CURETTAGE OF UTERUS    . Hysterescopy    . POLYPECTOMY  01/28/2017   Procedure: POLYPECTOMY;  Surgeon: Rogene Houston, MD;  [facility];  Service: Endoscopy;;  colon    FAMHx:  Family History  Problem Relation Age of Onset  . Cancer Mother   . Cancer Father   .  Stroke Maternal Grandmother     SOCHx:   reports that she quit smoking about 7 months ago. Her smoking use included cigarettes. She has a 35.00 pack-year smoking history. She has never used smokeless tobacco. She reports that she does not drink alcohol or use drugs.  ALLERGIES:  Allergies  Allergen Reactions  . Codeine Nausea And Vomiting  . Crestor [Rosuvastatin Calcium]     40mg  - myalgias    ROS: Pertinent items noted in HPI and remainder of comprehensive ROS otherwise negative.  HOME MEDS: Current Outpatient Medications on File Prior to Visit  Medication Sig Dispense Refill  .  acetaminophen (TYLENOL) 500 MG tablet Take 500 mg by mouth 2 (two) times daily as needed for moderate pain or headache.    . ALPRAZolam (XANAX) 1 MG tablet Take 1 mg by mouth at bedtime.     Marland Kitchen atorvastatin (LIPITOR) 40 MG tablet Take 40 mg by mouth daily.    Marland Kitchen ibuprofen (ADVIL,MOTRIN) 200 MG tablet Take 200 mg by mouth 2 (two) times daily as needed for headache or moderate pain.    . Multiple Vitamin (MULTIVITAMIN WITH MINERALS) TABS tablet Take 1 tablet by mouth daily.    Marland Kitchen omeprazole (PRILOSEC) 20 MG capsule Take 20 mg by mouth daily. May take an additional 20mg s as needed for acid reflux    . ranitidine (ZANTAC) 150 MG tablet Take 150 mg by mouth at bedtime.     No current facility-administered medications on file prior to visit.     LABS/IMAGING: CLINICAL DATA:  Thirty-six pack-year smoking history.  Asymptomatic.  EXAM: CT CHEST WITHOUT CONTRAST LOW-DOSE FOR LUNG CANCER SCREENING  TECHNIQUE: Multidetector CT imaging of the chest was performed following the standard protocol without IV contrast.  COMPARISON:  06/16/2016  FINDINGS: Cardiovascular: Aortic atherosclerosis. Normal heart size, without pericardial effusion. Right coronary artery atherosclerosis.  Mediastinum/Nodes: No mediastinal or definite hilar adenopathy, given limitations of unenhanced CT.  Lungs/Pleura: No pleural fluid. Tiny right-sided pulmonary nodules are not significantly changed. At least 1 of these is calcified. No highly suspicious or enlarging nodules identified.  Upper Abdomen: Cholecystectomy. Normal imaged portions of the liver, spleen, stomach, pancreas, adrenal glands, kidneys.  Musculoskeletal: No acute osseous abnormality.  IMPRESSION: 1. Lung-RADS 2, benign appearance or behavior. Continue annual screening with low-dose chest CT without contrast in 12 months. 2.  Aortic Atherosclerosis (ICD10-I70.0).   Electronically Signed   By: Abigail Miyamoto M.D.   On: 06/17/2017  15:23   Scans on Order [id number]   Scan on 06/17/2017 1:07 PM by Perfecto Kingdom L : LCS formScan on 06/17/2017 1:07 PM by Fonda Kinder : LCS form  Result Notes   Notes recorded by Christie Beckers, RN on 06/23/2017 at 9:40 AM EDT Pt informed of CT results per Eric Form, NP. PT verbalized understanding. Copy sent to PCP. Order placed for 1 yr f/u CT.  ------  Notes recorded by Christie Beckers, RN on 06/23/2017 at 9:12 AM EDT [facility] x1 ------  Notes recorded by Magdalen Spatz, NP on 06/19/2017 at 3:30 PM EDT Please call patient and let her know her low-dose CT scan was read as a lung RADS 2. Lung RADS 2: nodules that are benign in appearance and behavior with a very low likelihood of becoming a clinically active cancer due to size or lack of growth. Recommendation per radiology is for a repeat LDCT in 12 months. Please let her know we will order and schedule her follow-up annual screening for  August 2019. Please let her know there was notation of aortic atherosclerosis on the scan. She is not currently on any statin therapy. Please remind the patient that this is a non-gated exam therefore degree or severity of disease cannot be determined. Please have him follow up with his PCP regarding potential risk factor modification, dietary therapy or pharmacologic therapy if clinically indicated. Please place order for annual screening scan for August 2019, and fax results to PCP. Thank so much    LIPID PANEL:    Component Value Date/Time   CHOL 234 (H) 09/21/2017 0821   TRIG 132 09/21/2017 0821   HDL 56 09/21/2017 0821   CHOLHDL 4.2 09/21/2017 0821   LDLCALC 152 (H) 09/21/2017 0821    WEIGHTS: Wt Readings from Last 3 Encounters:  02/17/18 142 lb 6.4 oz (64.6 kg)  09/25/17 139 lb (63 kg)  06/23/17 140 lb 9.6 oz (63.8 kg)    VITALS: BP 132/82   Pulse 71   Ht 5\' 2"  (1.575 m)   Wt 142 lb 6.4 oz (64.6 kg)   BMI 26.05 kg/m   EXAM: General appearance: alert and no  distress Lungs: clear to auscultation bilaterally Heart: regular rate and rhythm Extremities: extremities normal, atraumatic, no cyanosis or edema Neurologic: Grossly normal  EKG: Normal sinus rhythm at 71, low voltage QRS-personally reviewed  ASSESSMENT: 1. Aortic and right coronary artery atherosclerosis, asymptomatic 2. Ongoing tobacco abuse 3. COPD 4. History of dyslipidemia-not on therapy  PLAN: 1.   Gabriela [name] reports intermittent symptoms of arthralgias, more than myalgias, which she thinks is related to the statin however I am not convinced.  I encouraged her to continue the atorvastatin which she is now taking every other day.  Will await repeat lab work which she had drawn on Monday.  Target LDL is less than 70.  If she is not at goal we may consider adjusting her medications so that they are tolerable.  Follow-up with me annually or sooner as necessary.  [name], MD, [facility], [place] Director of the Advanced Lipid Disorders &  Cardiovascular Risk Reduction Clinic Attending Cardiologist  Direct Dial: [phone]  Fax: [phone]  Website:  [email or web address] 02/17/2018, 4:24 PM

## 2018-02-17 NOTE — Patient Instructions (Signed)
Your physician wants you to follow-up in: ONE YEAR with Dr. Hilty. You will receive a reminder letter in the mail two months in advance. If you don't receive a letter, please call our office to schedule the follow-up appointment.  

## 2018-02-18 ENCOUNTER — Telehealth: Payer: Self-pay | Admitting: Internal Medicine

## 2018-02-18 NOTE — Telephone Encounter (Signed)
Received copy of labs from PCP office. Patient wanted a call with MD advice on her lipid panel. Left message that MD said her labs from 02/15/18 were OK - no changes recommended.   Total cholesterol: 137 Trigs: 69 HDL: 59 LDL: 64

## 2018-06-23 ENCOUNTER — Ambulatory Visit (INDEPENDENT_AMBULATORY_CARE_PROVIDER_SITE_OTHER)
Admission: RE | Admit: 2018-06-23 | Discharge: 2018-06-23 | Disposition: A | Discharge status: Home / Self Care | Payer: BLUE CROSS/BLUE SHIELD | Source: Ambulatory Visit | Attending: Acute Care | Admitting: Acute Care

## 2018-06-23 ENCOUNTER — Encounter (INDEPENDENT_AMBULATORY_CARE_PROVIDER_SITE_OTHER): Payer: Self-pay

## 2018-06-23 DIAGNOSIS — F17210 Nicotine dependence, cigarettes, uncomplicated: Secondary | ICD-10-CM | POA: Diagnosis not present

## 2018-06-23 DIAGNOSIS — Z122 Encounter for screening for malignant neoplasm of respiratory organs: Secondary | ICD-10-CM

## 2018-06-23 DIAGNOSIS — Z87891 Personal history of nicotine dependence: Secondary | ICD-10-CM | POA: Diagnosis not present

## 2018-06-29 ENCOUNTER — Other Ambulatory Visit: Payer: Self-pay | Admitting: Acute Care

## 2018-06-29 DIAGNOSIS — Z87891 Personal history of nicotine dependence: Secondary | ICD-10-CM

## 2018-06-29 DIAGNOSIS — Z122 Encounter for screening for malignant neoplasm of respiratory organs: Secondary | ICD-10-CM

## 2018-08-05 DIAGNOSIS — F419 Anxiety disorder, unspecified: Secondary | ICD-10-CM | POA: Diagnosis not present

## 2018-08-05 DIAGNOSIS — E785 Hyperlipidemia, unspecified: Secondary | ICD-10-CM | POA: Diagnosis not present

## 2018-08-05 DIAGNOSIS — M545 Low back pain: Secondary | ICD-10-CM | POA: Diagnosis not present

## 2018-08-05 DIAGNOSIS — J449 Chronic obstructive pulmonary disease, unspecified: Secondary | ICD-10-CM | POA: Diagnosis not present

## 2018-08-09 DIAGNOSIS — J449 Chronic obstructive pulmonary disease, unspecified: Secondary | ICD-10-CM | POA: Diagnosis not present

## 2018-08-09 DIAGNOSIS — M545 Low back pain: Secondary | ICD-10-CM | POA: Diagnosis not present

## 2018-08-09 DIAGNOSIS — E785 Hyperlipidemia, unspecified: Secondary | ICD-10-CM | POA: Diagnosis not present

## 2018-08-09 DIAGNOSIS — F419 Anxiety disorder, unspecified: Secondary | ICD-10-CM | POA: Diagnosis not present

## 2018-09-15 DIAGNOSIS — K210 Gastro-esophageal reflux disease with esophagitis: Secondary | ICD-10-CM | POA: Diagnosis not present

## 2018-09-15 DIAGNOSIS — M79645 Pain in left finger(s): Secondary | ICD-10-CM | POA: Diagnosis not present

## 2018-09-15 DIAGNOSIS — N951 Menopausal and female climacteric states: Secondary | ICD-10-CM | POA: Diagnosis not present

## 2018-09-15 DIAGNOSIS — M79672 Pain in left foot: Secondary | ICD-10-CM | POA: Diagnosis not present

## 2018-09-27 ENCOUNTER — Ambulatory Visit (INDEPENDENT_AMBULATORY_CARE_PROVIDER_SITE_OTHER): Payer: Self-pay | Admitting: Orthopaedic Surgery

## 2018-10-11 ENCOUNTER — Encounter (INDEPENDENT_AMBULATORY_CARE_PROVIDER_SITE_OTHER): Payer: Self-pay | Admitting: Orthopaedic Surgery

## 2018-10-11 ENCOUNTER — Ambulatory Visit (INDEPENDENT_AMBULATORY_CARE_PROVIDER_SITE_OTHER): Payer: Self-pay

## 2018-10-11 ENCOUNTER — Ambulatory Visit (INDEPENDENT_AMBULATORY_CARE_PROVIDER_SITE_OTHER): Payer: BLUE CROSS/BLUE SHIELD | Admitting: Orthopaedic Surgery

## 2018-10-11 DIAGNOSIS — M19042 Primary osteoarthritis, left hand: Secondary | ICD-10-CM

## 2018-10-11 DIAGNOSIS — M79645 Pain in left finger(s): Secondary | ICD-10-CM

## 2018-10-11 MED ORDER — METHYLPREDNISOLONE 4 MG PO TABS: ORAL_TABLET | ORAL | 0 refills | Status: DC

## 2018-10-11 MED ORDER — DICLOFENAC SODIUM 1 % TD GEL: 2.0000 g | Freq: Four times a day (QID) | TRANSDERMAL | 3 refills | Status: DC

## 2018-10-11 NOTE — Progress Notes (Signed)
Office Visit Note   Patient: Gabriela [name]           Date of Birth: 26-Apr-1961           MRN: [id number] Visit Date: 10/11/2018              Requested by: Sinda Du, MD [address] [place], [place] [address] PCP: Sinda Du, MD   Assessment & Plan: Visit Diagnoses:  1. Pain in left finger(s)   2. Osteoarthritis of finger of left hand     Plan: She understands fully that this is an arthritic process.  Certainly we could take her to the operating room as an outpatient under a digital block and light sedation and remove that prominent bone spur which may provide her some symptomatic relief.  For now though we will try topical anti-inflammatories and a 6-day steroid taper as well as her trying even CBD oil or other topical anti-inflammatories as well as oral anti-inflammatories to see if this will help.  We will see her back in 4 weeks to see how she is doing overall.  All question concerns were answered and addressed.  Follow-Up Instructions: Return in about 4 weeks (around 11/08/2018).   Orders:  Orders Placed This Encounter  Procedures  . XR Finger Little Left   Meds ordered this encounter  Medications  . diclofenac sodium (VOLTAREN) 1 % GEL    Sig: Apply 2 g topically 4 (four) times daily.    Dispense:  100 g    Refill:  3  . methylPREDNISolone (MEDROL) 4 MG tablet    Sig: Medrol dose pack. Take as instructed    Dispense:  21 tablet    Refill:  0      Procedures: No procedures performed   Clinical Data: No additional findings.   Subjective: Chief Complaint  Patient presents with  . Left Hand - Pain  Patient is a very pleasant 57 year old female who comes for evaluation treatment of left hand pain at the little finger.  X-rays were obtained by her primary care physician in April and they saw a calcification.  She is been having worsening pain and swelling of that finger for some time now.  She is very active individual.  She knows that she has  arthritis in most of her fingers in terms of the joints on both hands.  This 1 is the one is bothering her the most.  She points to the dorsal aspect of her fifth finger source of her pain and clinically and physically shows me that her left hand fifth finger is more swollen than the right side.  She denies any numbness and tingling.  Both hands are weak to her.  HPI  Review of Systems  She currently denies any headache, chest pain, shortness of breath, fever, chills, nausea, vomiting. Objective: Vital Signs: There were no vitals taken for this visit.  Physical Exam She is alert oriented x3 and in no acute distress Ortho Exam Examination of her left hand does show a prominent dorsal mass that appears to be in the soft tissue it may be related to the joint this is over the PIP joint.  The finger globally swollen compared to the other side but there is no redness.  She has limited pinch and grip strength bilaterally and she has nodules consistent with osteoarthritis of several of the IP joints of her hand.  The left fifth finger is the one that is more prominent for her and painful  to palpation.  There is no open wounds or drainage. Specialty Comments:  No specialty comments available.  Imaging: Xr Finger Little Left  Result Date: 10/11/2018 2 views of the left fifth finger show significant arthritic changes at the PIP joint.  There is a large para-articular osteophyte with calcification dorsally that was seen on previous films from earlier this year.  There is essentially bone-on-bone wear of that joint.    PMFS History: Patient Active Problem List   Diagnosis Date Noted  . Coronary artery calcification 06/24/2017  . Dyslipidemia 06/24/2017  . Smoker 06/24/2017  . Hx of colonic polyps 07/28/2016  . Irritable bowel syndrome 12/29/2011  . COLONIC POLYPS, HYPERPLASTIC, HX OF 08/28/2009  . ANXIETY 08/22/2009  . UNSPECIFIED ESOPHAGITIS 06/01/2009  . DUODENITIS WITHOUT MENTION OF  HEMORRHAGE 06/01/2009  . ENDOMETRIOSIS 06/01/2009  . FLATULENCE ERUCTATION AND GAS PAIN 06/01/2009  . Abdominal pain, left lower quadrant 06/01/2009  . FLANK PAIN, LEFT 06/01/2009   Past Medical History:  Diagnosis Date  . Anxiety   . Arthritis   . COPD (chronic obstructive pulmonary disease) ([place])   . Dyspnea   . Endometriosis   . GERD (gastroesophageal reflux disease)   . Headache   . High cholesterol   . Seizures ([place])    8 years ago    Family History  Problem Relation Age of Onset  . Cancer Mother   . Cancer Father   . Stroke Maternal Grandmother     Past Surgical History:  Procedure Laterality Date  . BACK SURGERY  09/24/2016  . CHOLECYSTECTOMY    . COLONOSCOPY N/A 01/28/2017   Procedure: COLONOSCOPY;  Surgeon: Rogene Houston, MD;  [facility];  Service: Endoscopy;  Laterality: N/A;  930  . cyst removed from ovaries    . DILATION AND CURETTAGE OF UTERUS    . Hysterescopy    . POLYPECTOMY  01/28/2017   Procedure: POLYPECTOMY;  Surgeon: Rogene Houston, MD;  [facility];  Service: Endoscopy;;  colon   Social History   Occupational History  . Not on file  Tobacco Use  . Smoking status: Former Smoker    Packs/day: 1.00    Years: 35.00    Pack years: 35.00    Types: Cigarettes    Last attempt to quit: 06/30/2017    Years since quitting: 1.2  . Smokeless tobacco: Never Used  Substance and Sexual Activity  . Alcohol use: No  . Drug use: No  . Sexual activity: Not on file

## 2018-10-22 ENCOUNTER — Other Ambulatory Visit: Payer: Self-pay | Admitting: Internal Medicine

## 2018-11-08 ENCOUNTER — Ambulatory Visit (INDEPENDENT_AMBULATORY_CARE_PROVIDER_SITE_OTHER): Payer: BLUE CROSS/BLUE SHIELD | Admitting: Orthopaedic Surgery

## 2018-11-10 DIAGNOSIS — M79645 Pain in left finger(s): Secondary | ICD-10-CM | POA: Diagnosis not present

## 2018-11-10 DIAGNOSIS — K210 Gastro-esophageal reflux disease with esophagitis: Secondary | ICD-10-CM | POA: Diagnosis not present

## 2018-11-10 DIAGNOSIS — N951 Menopausal and female climacteric states: Secondary | ICD-10-CM | POA: Diagnosis not present

## 2018-11-10 DIAGNOSIS — M79672 Pain in left foot: Secondary | ICD-10-CM | POA: Diagnosis not present

## 2018-12-07 DIAGNOSIS — Z1231 Encounter for screening mammogram for malignant neoplasm of breast: Secondary | ICD-10-CM | POA: Diagnosis not present

## 2018-12-07 DIAGNOSIS — Z6826 Body mass index (BMI) 26.0-26.9, adult: Secondary | ICD-10-CM | POA: Diagnosis not present

## 2018-12-07 DIAGNOSIS — Z01419 Encounter for gynecological examination (general) (routine) without abnormal findings: Secondary | ICD-10-CM | POA: Diagnosis not present

## 2018-12-28 DIAGNOSIS — N941 Unspecified dyspareunia: Secondary | ICD-10-CM | POA: Diagnosis not present

## 2019-01-06 ENCOUNTER — Ambulatory Visit (INDEPENDENT_AMBULATORY_CARE_PROVIDER_SITE_OTHER): Payer: BLUE CROSS/BLUE SHIELD | Admitting: Otolaryngology

## 2019-01-06 DIAGNOSIS — K146 Glossodynia: Secondary | ICD-10-CM

## 2019-01-06 DIAGNOSIS — H6121 Impacted cerumen, right ear: Secondary | ICD-10-CM

## 2019-01-06 DIAGNOSIS — H903 Sensorineural hearing loss, bilateral: Secondary | ICD-10-CM | POA: Diagnosis not present

## 2019-01-12 ENCOUNTER — Other Ambulatory Visit: Payer: Self-pay | Admitting: Dermatology

## 2019-01-12 DIAGNOSIS — L821 Other seborrheic keratosis: Secondary | ICD-10-CM | POA: Diagnosis not present

## 2019-01-12 DIAGNOSIS — D485 Neoplasm of uncertain behavior of skin: Secondary | ICD-10-CM | POA: Diagnosis not present

## 2019-01-12 DIAGNOSIS — L918 Other hypertrophic disorders of the skin: Secondary | ICD-10-CM | POA: Diagnosis not present

## 2019-01-12 DIAGNOSIS — D229 Melanocytic nevi, unspecified: Secondary | ICD-10-CM | POA: Diagnosis not present

## 2019-01-18 DIAGNOSIS — Z23 Encounter for immunization: Secondary | ICD-10-CM | POA: Diagnosis not present

## 2019-01-18 DIAGNOSIS — N951 Menopausal and female climacteric states: Secondary | ICD-10-CM | POA: Diagnosis not present

## 2019-01-18 DIAGNOSIS — Z1331 Encounter for screening for depression: Secondary | ICD-10-CM | POA: Diagnosis not present

## 2019-01-18 DIAGNOSIS — E7849 Other hyperlipidemia: Secondary | ICD-10-CM | POA: Diagnosis not present

## 2019-01-18 DIAGNOSIS — K148 Other diseases of tongue: Secondary | ICD-10-CM | POA: Diagnosis not present

## 2019-01-18 DIAGNOSIS — E663 Overweight: Secondary | ICD-10-CM | POA: Diagnosis not present

## 2019-01-18 DIAGNOSIS — M5380 Other specified dorsopathies, site unspecified: Secondary | ICD-10-CM | POA: Diagnosis not present

## 2019-03-03 ENCOUNTER — Ambulatory Visit (INDEPENDENT_AMBULATORY_CARE_PROVIDER_SITE_OTHER): Payer: BLUE CROSS/BLUE SHIELD | Admitting: Otolaryngology

## 2019-03-03 DIAGNOSIS — K1230 Oral mucositis (ulcerative), unspecified: Secondary | ICD-10-CM | POA: Diagnosis not present

## 2019-03-03 DIAGNOSIS — K146 Glossodynia: Secondary | ICD-10-CM

## 2019-07-11 DIAGNOSIS — Z0000 Encounter for general adult medical examination without abnormal findings: Secondary | ICD-10-CM | POA: Diagnosis not present

## 2019-07-11 DIAGNOSIS — E7870 Disorder of bile acid and cholesterol metabolism, unspecified: Secondary | ICD-10-CM | POA: Diagnosis not present

## 2019-07-18 DIAGNOSIS — K148 Other diseases of tongue: Secondary | ICD-10-CM | POA: Diagnosis not present

## 2019-07-18 DIAGNOSIS — N951 Menopausal and female climacteric states: Secondary | ICD-10-CM | POA: Diagnosis not present

## 2019-07-18 DIAGNOSIS — F0390 Unspecified dementia without behavioral disturbance: Secondary | ICD-10-CM | POA: Diagnosis not present

## 2019-07-18 DIAGNOSIS — E663 Overweight: Secondary | ICD-10-CM | POA: Diagnosis not present

## 2019-07-18 DIAGNOSIS — Z0000 Encounter for general adult medical examination without abnormal findings: Secondary | ICD-10-CM | POA: Diagnosis not present

## 2019-08-29 ENCOUNTER — Encounter (INDEPENDENT_AMBULATORY_CARE_PROVIDER_SITE_OTHER): Payer: Self-pay

## 2019-08-29 ENCOUNTER — Other Ambulatory Visit: Payer: Self-pay

## 2019-08-29 ENCOUNTER — Ambulatory Visit (INDEPENDENT_AMBULATORY_CARE_PROVIDER_SITE_OTHER)
Admission: RE | Admit: 2019-08-29 | Discharge: 2019-08-29 | Disposition: A | Discharge status: Home / Self Care | Payer: BC Managed Care – PPO | Source: Ambulatory Visit | Attending: Acute Care | Admitting: Acute Care

## 2019-08-29 DIAGNOSIS — Z122 Encounter for screening for malignant neoplasm of respiratory organs: Secondary | ICD-10-CM

## 2019-08-29 DIAGNOSIS — Z87891 Personal history of nicotine dependence: Secondary | ICD-10-CM | POA: Diagnosis not present

## 2019-09-08 ENCOUNTER — Other Ambulatory Visit: Payer: Self-pay | Admitting: *Deleted

## 2019-09-08 DIAGNOSIS — Z87891 Personal history of nicotine dependence: Secondary | ICD-10-CM

## 2019-09-08 DIAGNOSIS — Z122 Encounter for screening for malignant neoplasm of respiratory organs: Secondary | ICD-10-CM

## 2019-09-20 DIAGNOSIS — K136 Irritative hyperplasia of oral mucosa: Secondary | ICD-10-CM | POA: Diagnosis not present

## 2019-09-20 DIAGNOSIS — K1321 Leukoplakia of oral mucosa, including tongue: Secondary | ICD-10-CM | POA: Diagnosis not present

## 2020-03-16 ENCOUNTER — Telehealth: Payer: BC Managed Care – PPO | Admitting: Physician Assistant

## 2020-03-19 NOTE — Progress Notes (Deleted)
{Choose 1 Note Type (Video or Telephone):[phone]}   The patient was identified using 2 identifiers.  Date:  03/19/2020   ID:  Gabriela [name], DOB [birth year], MRN [id number]  {Patient Location:[phone]::"Home"} {Provider Location:[id number]::"Home"}  PCP:  Shon Baton, MD  Cardiologist:  [name], MD  Electrophysiologist:  None   Evaluation Performed:  {Choose Visit [phone]::"Follow-Up Visit"}  Chief Complaint:  ***  History of Present Illness:    Gabriela [name] is a 59 y.o. female with anxiety, tobacco use, HLD, and coronary atherosclerosis. She was first evaluated by Dr. Debara Pickett for coronary atherosclerosis seen on low dose CT scan for lung cancer screening. Statin therapy was titrated and her last LDL was below 70. She was taking statin every other day for suspected arthralgias. She was last seen 02/17/18 and was doing well at that time. Last LDL was 64.    Hyperlipidemia - last LDL was 64 - ?still taking a statin?   Coronary atherosclerosis - risk factor modification   Smoker - needs to quit    The patient {does/does not:200015} have symptoms concerning for COVID-19 infection (fever, chills, cough, or new shortness of breath).    Past Medical History:  Diagnosis Date  . Anxiety   . Arthritis   . COPD (chronic obstructive pulmonary disease) ([place])   . Dyspnea   . Endometriosis   . GERD (gastroesophageal reflux disease)   . Headache   . High cholesterol   . Seizures ([place])    8 years ago   Past Surgical History:  Procedure Laterality Date  . BACK SURGERY  09/24/2016  . CHOLECYSTECTOMY    . COLONOSCOPY N/A 01/28/2017   Procedure: COLONOSCOPY;  Surgeon: Rogene Houston, MD;  [facility];  Service: Endoscopy;  Laterality: N/A;  930  . cyst removed from ovaries    . DILATION AND CURETTAGE OF UTERUS    . Hysterescopy    . POLYPECTOMY  01/28/2017   Procedure: POLYPECTOMY;  Surgeon: Rogene Houston, MD;  [facility];  Service:  Endoscopy;;  colon     No outpatient medications have been marked as taking for the 03/20/20 encounter (Appointment) with Ledora Bottcher, [place].     Allergies:   Codeine and Crestor [rosuvastatin calcium]   Social History   Tobacco Use  . Smoking status: Former Smoker    Packs/day: 1.00    Years: 35.00    Pack years: 35.00    Types: Cigarettes    Quit date: 06/30/2017    Years since quitting: 2.7  . Smokeless tobacco: Never Used  Substance Use Topics  . Alcohol use: No  . Drug use: No     Family Hx: The patient's family history includes Cancer in her father and mother; Stroke in her maternal grandmother.  ROS:   Please see the history of present illness.    *** All other systems reviewed and are negative.   Prior CV studies:   The following studies were reviewed today:  ***  Labs/Other Tests and Data Reviewed:    EKG:  {EKG/Telemetry Strips Reviewed:[phone]}  Recent Labs: No results found for requested labs within last 8760 hours.   Recent Lipid Panel Lab Results  Component Value Date/Time   CHOL 234 (H) 09/21/2017 08:21 AM   TRIG 132 09/21/2017 08:21 AM   HDL 56 09/21/2017 08:21 AM   CHOLHDL 4.2 09/21/2017 08:21 AM   LDLCALC 152 (H) 09/21/2017 08:21 AM    Wt Readings from Last 3 Encounters:  02/17/18 142 lb 6.4 oz (64.6 kg)  09/25/17 139 lb (63 kg)  06/23/17 140 lb 9.6 oz (63.8 kg)     Objective:    Vital Signs:  There were no vitals taken for this visit.   {[facility] Virtual Exam (Optional):[phone]::"VITAL SIGNS:  reviewed"}  ASSESSMENT & PLAN:    1. ***  COVID-19 Education: The signs and symptoms of COVID-19 were discussed with the patient and how to seek care for testing (follow up with PCP or arrange E-visit).  ***The importance of social distancing was discussed today.  Time:   Today, I have spent *** minutes with the patient with telehealth technology discussing the above problems.     Medication Adjustments/Labs and Tests  Ordered: Current medicines are reviewed at length with the patient today.  Concerns regarding medicines are outlined above.   Tests Ordered: No orders of the defined types were placed in this encounter.   Medication Changes: No orders of the defined types were placed in this encounter.   Follow Up:  {F/U Format:[phone]} {follow up:15908}  Signed, Ledora Bottcher, PA  03/19/2020 8:38 PM    [facility]

## 2020-03-20 ENCOUNTER — Telehealth: Payer: Self-pay

## 2020-03-20 ENCOUNTER — Telehealth: Payer: Self-pay | Admitting: Physician Assistant

## 2020-03-20 NOTE — Telephone Encounter (Signed)
Attempted to contact pt x 3 for virtual appointment scheduled for today 5/25. Unable to reach pt. Left message to call office and reschedule.

## 2020-07-26 ENCOUNTER — Other Ambulatory Visit: Payer: Self-pay | Admitting: Internal Medicine

## 2020-07-26 DIAGNOSIS — R1011 Right upper quadrant pain: Secondary | ICD-10-CM

## 2020-08-01 ENCOUNTER — Ambulatory Visit
Admission: RE | Admit: 2020-08-01 | Discharge: 2020-08-01 | Disposition: A | Discharge status: Home / Self Care | Payer: 59 | Source: Ambulatory Visit | Attending: Internal Medicine | Admitting: Internal Medicine

## 2020-08-01 DIAGNOSIS — R1011 Right upper quadrant pain: Secondary | ICD-10-CM

## 2020-08-28 ENCOUNTER — Other Ambulatory Visit: Payer: Self-pay | Admitting: Internal Medicine

## 2020-08-28 DIAGNOSIS — Z87891 Personal history of nicotine dependence: Secondary | ICD-10-CM

## 2020-09-12 ENCOUNTER — Ambulatory Visit: Payer: 59

## 2020-09-17 ENCOUNTER — Ambulatory Visit: Payer: 59

## 2020-11-22 ENCOUNTER — Other Ambulatory Visit: Payer: Self-pay | Admitting: Orthopedic Surgery

## 2020-12-06 ENCOUNTER — Other Ambulatory Visit: Payer: Self-pay | Admitting: Orthopedic Surgery

## 2020-12-20 ENCOUNTER — Ambulatory Visit: Payer: 59 | Admitting: Dermatology

## 2021-07-15 ENCOUNTER — Encounter (INDEPENDENT_AMBULATORY_CARE_PROVIDER_SITE_OTHER): Payer: Self-pay | Admitting: *Deleted

## 2021-08-06 ENCOUNTER — Encounter (INDEPENDENT_AMBULATORY_CARE_PROVIDER_SITE_OTHER): Payer: Self-pay | Admitting: *Deleted

## 2021-08-12 ENCOUNTER — Encounter: Payer: Self-pay | Admitting: Podiatry

## 2021-08-12 ENCOUNTER — Ambulatory Visit (INDEPENDENT_AMBULATORY_CARE_PROVIDER_SITE_OTHER): Payer: 59

## 2021-08-12 ENCOUNTER — Other Ambulatory Visit: Payer: Self-pay

## 2021-08-12 ENCOUNTER — Ambulatory Visit (INDEPENDENT_AMBULATORY_CARE_PROVIDER_SITE_OTHER): Payer: 59 | Admitting: Podiatry

## 2021-08-12 DIAGNOSIS — M779 Enthesopathy, unspecified: Secondary | ICD-10-CM

## 2021-08-12 DIAGNOSIS — M79672 Pain in left foot: Secondary | ICD-10-CM | POA: Diagnosis not present

## 2021-08-12 DIAGNOSIS — M79671 Pain in right foot: Secondary | ICD-10-CM

## 2021-08-12 DIAGNOSIS — M7751 Other enthesopathy of right foot: Secondary | ICD-10-CM

## 2021-08-12 MED ORDER — DICLOFENAC SODIUM 75 MG PO TBEC: 75.0000 mg | DELAYED_RELEASE_TABLET | Freq: Two times a day (BID) | ORAL | 2 refills | Status: DC

## 2021-08-12 MED ORDER — TRIAMCINOLONE ACETONIDE 10 MG/ML IJ SUSP
10.0000 mg | Freq: Once | INTRAMUSCULAR | Status: AC
  Administered 2021-08-12: 10 mg

## 2021-08-12 NOTE — Progress Notes (Signed)
Subjective:   Patient ID: Gabriela [name], female   DOB: 60 y.o.   MRN: [id number]   HPI Patient presents with exquisite discomfort on the top of the right foot states its been there around 6 months and she does not remember injury.  Also has other pains but it seems to be mostly in the forefoot that she has the worst problems.  Patient does not smoke likes to be active if possible   Review of Systems  All other systems reviewed and are negative.      Objective:  Physical Exam Vitals and nursing note reviewed.  Constitutional:      Appearance: She is well-developed.  Pulmonary:     Effort: Pulmonary effort is normal.  Musculoskeletal:        General: Normal range of motion.  Skin:    General: Skin is warm.  Neurological:     Mental Status: She is alert.    Neurovascular status intact muscle strength found to be adequate range of motion adequate inflammation fluid around the second MPJ right with pain upon palpation with moderate discomfort also in the arch and heel area.  Good digital perfusion well oriented x3 and     Assessment:  Inflammatory capsulitis second MPJ right foot     Plan:  H&P reviewed condition and went ahead today I anesthetized the right forefoot sterile prep done aspirated the forefoot and was able to get out a small amount of clear fluid out of the second MPJ injected with quarter cc dexamethasone Kenalog apply thick plantar padding with instructions on usage reappoint to recheck  X-rays indicate no signs of fracture parabola or capsulitis or arthritis with this condition

## 2021-08-16 ENCOUNTER — Other Ambulatory Visit: Payer: Self-pay | Admitting: Podiatry

## 2021-08-16 DIAGNOSIS — M779 Enthesopathy, unspecified: Secondary | ICD-10-CM

## 2021-08-26 ENCOUNTER — Ambulatory Visit: Payer: 59 | Admitting: Podiatry

## 2021-08-28 ENCOUNTER — Other Ambulatory Visit: Payer: Self-pay

## 2021-08-28 ENCOUNTER — Ambulatory Visit (INDEPENDENT_AMBULATORY_CARE_PROVIDER_SITE_OTHER): Payer: 59 | Admitting: Podiatry

## 2021-08-28 ENCOUNTER — Encounter: Payer: Self-pay | Admitting: Podiatry

## 2021-08-28 DIAGNOSIS — M7751 Other enthesopathy of right foot: Secondary | ICD-10-CM | POA: Diagnosis not present

## 2021-08-28 MED ORDER — TRIAMCINOLONE ACETONIDE 10 MG/ML IJ SUSP
20.0000 mg | Freq: Once | INTRAMUSCULAR | Status: AC
  Administered 2021-08-28: 20 mg

## 2021-08-28 NOTE — Progress Notes (Signed)
Subjective:   Patient ID: Gabriela [name], female   DOB: 60 y.o.   MRN: [id number]   HPI It appears the second joint is doing well but the third and fourth are bothering her with swelling and those at this point seem to be the ones that have become symptomatic   ROS      Objective:  Physical Exam  Neuro vascular status intact inflammatory capsulitis around the third and fourth MPJs right painful when pressed and the second appears to be doing well     Assessment:  Acute capsulitis of the third and fourth MPJ right with second that is doing well     Plan:  H&P reviewed condition did a sterile prep of the forefoot right and aspirated the third and fourth MPJs getting out a small amount of clear fluid and injected quarter cc dexamethasone Kenalog into each joint which was tolerated well.  Reappoint for [place] to recheck

## 2021-09-03 ENCOUNTER — Other Ambulatory Visit: Payer: Self-pay

## 2021-09-03 ENCOUNTER — Ambulatory Visit (INDEPENDENT_AMBULATORY_CARE_PROVIDER_SITE_OTHER): Payer: 59 | Admitting: Gastroenterology

## 2021-09-03 ENCOUNTER — Encounter (INDEPENDENT_AMBULATORY_CARE_PROVIDER_SITE_OTHER): Payer: Self-pay | Admitting: Gastroenterology

## 2021-09-03 VITALS — BP 135/85 | HR 79 | Temp 98.2°F | Ht 62.0 in | Wt 157.9 lb

## 2021-09-03 DIAGNOSIS — R197 Diarrhea, unspecified: Secondary | ICD-10-CM

## 2021-09-03 DIAGNOSIS — K219 Gastro-esophageal reflux disease without esophagitis: Secondary | ICD-10-CM | POA: Diagnosis not present

## 2021-09-03 DIAGNOSIS — R109 Unspecified abdominal pain: Secondary | ICD-10-CM

## 2021-09-03 MED ORDER — DICYCLOMINE HCL 10 MG PO CAPS: 10.0000 mg | ORAL_CAPSULE | Freq: Three times a day (TID) | ORAL | 3 refills | Status: AC | PRN

## 2021-09-03 NOTE — Progress Notes (Signed)
Referring Provider: Shon Baton, MD Primary Care Physician:  Shon Baton, MD Primary GI Physician: newly established  Chief Complaint  Patient presents with   Abdominal Pain    Patient here today due to lower abdominal pain. Patient states at first this felt like menstruation cramps two months ago. Now also experiencing RUQ pain. She has no nausea, vomiting. She states she has frequent bm's 3-4 times per day, neither diarrhea or constipation. Omeprazole 20 mg no longer helping reflux.   HPI:   Gabriela [name] is a 60 y.o. female with past medical history of anxiety, arthritis, COPD, endometriosis, GERD, high cholesterol, seizures.   Patient presenting today as a new patient for lower abdominal pain and uncontrolled reflux.   Abdominal Pain: lower abdominal pain/cramping and frequent urination since July, she was seen by GYN and had negative urinalysis and culture clear, however, she was treated with course of augmentin prior to culture results with some improvement in her symptoms, pelvic US 06/26/21 without any acute findings.   She reports that lower abdominal pain is improved some, she also has occasional RUQ pain. Pain initially was everyday, now it occurs maybe 3-4x/week. She reports that she may have a BM 3-4x/day, frequency of stools increased after cholecystectomy in early 2000s. stools are usually on the looser side but she denies watery stools (6 on bristol stool scale) she reports looser stools have been ongoing for sometime, though she cannot pinpoint how long ago they started, she denies constipation. She reports that having a BM improves her lower abdominal pain. She also has some associated bloating that does not seem to be triggered by anything specific. She denies any blood in stools or black stools but has occasional green stools. No fecal incontinence. She denies nausea or vomiting. She denies any weight loss. She reports she has gained weight due to not being able to walk due to  a foot injury. She denies changes in her appetite. Notably, patient had RUQ [place] 07/2020 for RUQ pain, that was unremarkable, hx of prior cholecystectomy in early 2000s, states she feels stool frequency increased after that but is worse recently.   Reflux: currently on omeprazole 20mg , however she is only taking this once daily as needed. she is having acid regurgitation almost daily, she denies dysphagia, early satiety or postprandial pain.   NSAID use: uses advil occasionally Social hx:no etoh, previous tobacco user Fam hx: no crc, liver disease, or IBD  Last Colonoscopy:01/28/17- One 5 mm polyp in the proximal ascending colon, (tubular adenoma) - External hemorrhoids. - Anal papilla(e) were hypertrophied. Last Endoscopy:03/12/05 ([facility])ESOPHAGEAL INFLAMMATION: Brushing/Esoph Inflamtn taken. (No malignant cells, no fungal organisms) Comments: whitish nodules, r/o Candida vs lymphoid follicles.  - Normal: Distal Esophagus to Cardia.  - MUCOSAL ABNORMALITY: Duodenal Bulb to Duodenal Apex. Erythematous mucosa. Edema present. Biopsy/Mucosal Abn taken. Benign small bowel mucosa, no active inflammation or villous atrophy  Recommendations:  Repeat colonoscopy April 2025  Past Medical History:  Diagnosis Date   Anxiety    Arthritis    COPD (chronic obstructive pulmonary disease) (HCC)    Dyspnea    Endometriosis    GERD (gastroesophageal reflux disease)    Headache    High cholesterol    Seizures (HCC)    8 years ago    Past Surgical History:  Procedure Laterality Date   BACK SURGERY  09/24/2016   CHOLECYSTECTOMY     COLONOSCOPY N/A 01/28/2017   Procedure: COLONOSCOPY;  Surgeon: Rogene Houston, MD;  [facility];  Service: Endoscopy;  Laterality: N/A;  930   cyst removed from ovaries     DILATION AND CURETTAGE OF UTERUS     Hysterescopy     POLYPECTOMY  01/28/2017   Procedure: POLYPECTOMY;  Surgeon: Rogene Houston, MD;  [facility];  Service: Endoscopy;;   colon    Current Outpatient Medications  Medication Sig Dispense Refill   acetaminophen (TYLENOL) 500 MG tablet Take 500 mg by mouth 2 (two) times daily as needed for moderate pain or headache.     ALPRAZolam (XANAX) 1 MG tablet Take 1 mg by mouth at bedtime.      atorvastatin (LIPITOR) 40 MG tablet TAKE ONE TABLET (40MG  TOTAL) BY MOUTH DAILY 90 tablet 1   omeprazole (PRILOSEC) 20 MG capsule TAKE ONE CAPSULE BY MOUTH TWICE A DAY 90 capsule 6   OVER THE COUNTER MEDICATION Vit D 3 (50 mcg) 2000 IU daily.     No current facility-administered medications for this visit.    Allergies as of 09/03/2021 - Review Complete 09/03/2021  Allergen Reaction Noted   Codeine Nausea And Vomiting    Crestor [rosuvastatin calcium]  09/25/2017    Family History  Problem Relation Age of Onset   Cancer Mother    Cancer Father    Stroke Maternal Grandmother     Social History   Socioeconomic History   Marital status: Divorced    Spouse name: Not on file   Number of children: Not on file   Years of education: Not on file   Highest education level: Not on file  Occupational History   Not on file  Tobacco Use   Smoking status: Former    Packs/day: 1.00    Years: 35.00    Pack years: 35.00    Types: Cigarettes    Quit date: 06/30/2017    Years since quitting: 4.1   Smokeless tobacco: Never  Vaping Use   Vaping Use: Never used  Substance and Sexual Activity   Alcohol use: No   Drug use: No   Sexual activity: Not on file  Other Topics Concern   Not on file  Social History Narrative   Not on file   Social Determinants of Health   Financial Resource Strain: Not on file  Food Insecurity: Not on file  Transportation Needs: Not on file  Physical Activity: Not on file  Stress: Not on file  Social Connections: Not on file   Review of systems General: negative for malaise, night sweats, fever, chills, weight loss Neck: Negative for lumps, goiter, pain and significant neck swelling Resp:  Negative for cough, wheezing, dyspnea at rest CV: Negative for chest pain, leg swelling, palpitations, orthopnea GI: denies melena, hematochezia, nausea, vomiting, constipation, dysphagia, odyonophagia, early satiety or unintentional weight loss. +diarrhea +lower abdominal cramping MSK: Negative for joint pain or swelling, back pain, and muscle pain. Derm: Negative for itching or rash Psych: Denies depression, anxiety, memory loss, confusion. No homicidal or suicidal ideation.  Heme: Negative for prolonged bleeding, bruising easily, and swollen nodes. Endocrine: Negative for cold or heat intolerance, polyuria, polydipsia and goiter. Neuro: negative for tremor, gait imbalance, syncope and seizures. The remainder of the review of systems is noncontributory.  Physical Exam: BP 135/85 (BP Location: Left Arm, Patient Position: Sitting, Cuff Size: Large)   Pulse 79   Temp 98.2 F (36.8 C) (Oral)   Ht 5\' 2"  (1.575 m)   Wt 157 lb 14.4 oz (71.6 kg)   BMI 28.88 kg/m  General:   Alert and oriented. No distress noted. Pleasant and cooperative.  Head:  Normocephalic and atraumatic. Eyes:  Conjuctiva clear without scleral icterus. Mouth:  Oral mucosa pink and moist. Good dentition. No lesions. Heart: Normal rate and rhythm, s1 and s2 heart sounds present.  Lungs: Clear lung sounds in all lobes. Respirations equal and unlabored. Abdomen:  +BS, soft, non-tender and non-distended. No rebound or guarding. No HSM or masses noted. Derm: No palmar erythema or jaundice Msk:  Symmetrical without gross deformities. Normal posture. Extremities:  Without edema. Neurologic:  Alert and  oriented x4 Psych:  Alert and cooperative. Normal mood and affect.  Invalid input(s): 6 MONTHS   ASSESSMENT: Gabriela [name] is a 60 y.o. female presenting today for uncontrolled reflux, lower abdominal cramping and frequent, loose stools.  Taking omeprazole 20mg  only PRN, having acid regurgitation multiple times per week, at  no specific time of day. We discussed the importance of regularly taking PPI, she will start taking this once daily in the mornings, 30 mins prior to eating, if no improvement after 2 weeks, she will increase to 40mg  daily, she will let me know if she continues to have reflux symptoms.she denies early satiety, dysphagia, odynophagia, or changes in appetite. She has no postprandial abdominal pain.   Lower abdominal cramping since june with intermittent RUQ pain, RUQ pain has been off and on for the past few years, hx of cholecystectomy. abdominal cramping was initially every day, UA, Urine culture and Pelvic US in august all negative. She has 3-4 looser stools per day, abdominal cramping usually relieved with BM. She denies melena, rectal bleeding, weight loss, changes in appetite or constipation. I suspect she is experiencing IBS-D, however, she reports that stools became more frequent after cholecystectomy but have become looser over the past few months, she cannot poinpoint exactly when, but states this started prior to lower abdominal cramping. She denies any rectal bleeding or melena but reports stools are sometimes green in color. She could also be experiencing bile acid diarrhea given absence of gallbladder. We will check for celiac disease, and proceed with stool studies to r/o infectious etiology. I am also sending Rx for bentyl 10mg  TID PRN. She should also try implementing low FODMAP diet and keep food diary to see if dietary measures help with her diarrhea. She will let me know if there is no improvement in her symptoms or if new GI symptoms arise.  Will hold off on proceeding with diagnostic endoscopic evaluation given patient's lack of red flag symptoms. Further recommendations to follow after stool testing and celiac panel results.   PLAN:  Take omeprazole 20mg  every morning 30 mins before eating, increase to 40mg  daily if no improvement in 2 weeks, patient to let me know if no improvement 2.  Celiac panel, o&p, c diff, gi path panel 3. Bentyl 10mg  TID PRN 4. Trial low FODMAP diet 5. Food diary to evaluate for trigger foods   Follow Up: 6 months  Maree Ainley L. Alver Sorrow, MSN, APRN, AGNP-C Adult-Gerontology Nurse Practitioner [facility]

## 2021-09-03 NOTE — Patient Instructions (Addendum)
We will check stool studies and celiac panel today to rule out infectious causes of your diarrhea. I suspect you are experiencing some IBS. I have sent a prescription for Bentyl 10mg , you can take this up to three times per day for abdominal cramping and frequent bowel movements.  I have also provided information on the low FODMAP diet which can be helpful for people with IBS, you can try this and see if you have any results. You should also keep a food diary to keep track of what you eat to determine if certain foods seem to trigger worsening of symptoms.  Lets try taking the omeprazole 20mg  once daily, every morning, 30 minutes prior to eating, if after 2 weeks, you see no improvement in symptoms, please increase your dose to two 20mg  pills per day. Please let me know if reflux symptoms do not improve.   I will see you back in 6 months to follow up on your symptoms, unless you need me sooner.

## 2021-09-05 LAB — CELIAC DISEASE PANEL
(tTG) Ab, IgA: <1 U/mL
(tTG) Ab, IgG: <1 U/mL
Gliadin IgA: <1 U/mL
Gliadin IgG: <1 U/mL
Immunoglobulin A: 82 mg/dL (ref 70–320)

## 2021-09-13 LAB — GASTROINTESTINAL PATHOGEN PANEL PCR
C. difficile Tox A/B, PCR: NOT DETECTED
Campylobacter, PCR: NOT DETECTED
Cryptosporidium, PCR: NOT DETECTED
E coli (ETEC) LT/ST PCR: NOT DETECTED
E coli (STEC) stx1/stx2, PCR: NOT DETECTED
E coli 0157, PCR: NOT DETECTED
Giardia lamblia, PCR: NOT DETECTED
Norovirus, PCR: NOT DETECTED
Rotavirus A, PCR: NOT DETECTED
Salmonella, PCR: NOT DETECTED
Shigella, PCR: NOT DETECTED

## 2021-09-13 LAB — C. DIFFICILE GDH AND TOXIN A/B
GDH ANTIGEN: NOT DETECTED
MICRO NUMBER:: 12627594
SPECIMEN QUALITY:: ADEQUATE
TOXIN A AND B: NOT DETECTED

## 2021-09-13 LAB — OVA AND PARASITE EXAMINATION
CONCENTRATE RESULT:: NONE SEEN
MICRO NUMBER:: 12627102
SPECIMEN QUALITY:: ADEQUATE
TRICHROME RESULT:: NONE SEEN

## 2021-11-12 ENCOUNTER — Ambulatory Visit (INDEPENDENT_AMBULATORY_CARE_PROVIDER_SITE_OTHER): Payer: 59 | Admitting: Internal Medicine

## 2022-02-07 ENCOUNTER — Other Ambulatory Visit (HOSPITAL_COMMUNITY): Payer: Self-pay | Admitting: Podiatry

## 2022-02-07 ENCOUNTER — Other Ambulatory Visit: Payer: Self-pay | Admitting: Podiatry

## 2022-02-07 DIAGNOSIS — M79671 Pain in right foot: Secondary | ICD-10-CM

## 2022-02-07 DIAGNOSIS — M7741 Metatarsalgia, right foot: Secondary | ICD-10-CM

## 2022-02-27 ENCOUNTER — Other Ambulatory Visit (HOSPITAL_COMMUNITY): Payer: Self-pay

## 2022-02-27 ENCOUNTER — Encounter (HOSPITAL_COMMUNITY): Payer: Self-pay

## 2022-03-03 ENCOUNTER — Encounter (INDEPENDENT_AMBULATORY_CARE_PROVIDER_SITE_OTHER): Payer: Self-pay | Admitting: Gastroenterology

## 2022-03-03 ENCOUNTER — Ambulatory Visit (INDEPENDENT_AMBULATORY_CARE_PROVIDER_SITE_OTHER): Payer: 59 | Admitting: Gastroenterology

## 2022-03-03 VITALS — BP 146/83 | HR 68 | Temp 98.1°F | Ht 62.0 in | Wt 158.3 lb

## 2022-03-03 DIAGNOSIS — K580 Irritable bowel syndrome with diarrhea: Secondary | ICD-10-CM | POA: Diagnosis not present

## 2022-03-03 DIAGNOSIS — R6881 Early satiety: Secondary | ICD-10-CM | POA: Diagnosis not present

## 2022-03-03 DIAGNOSIS — K219 Gastro-esophageal reflux disease without esophagitis: Secondary | ICD-10-CM | POA: Diagnosis not present

## 2022-03-03 NOTE — Progress Notes (Signed)
Gabriela [name], M.D. ?Gastroenterology & Hepatology ?[facility] ?[address] ?[place], [place] [address] ? ?Primary Care Physician: ?Shon Baton, MD ?[address] ?[place] [address] ? ?I will communicate my assessment and recommendations to the referring MD via EMR. ? ?Problems: ?IBS-D ?GERD ? ?History of Present Illness: ?Gabriela [name] is a 61 y.o. female with medical history of COPD, anxiety, GERD, seizures, hyperlipidemia, IBS-D, who presents for follow up of IBS and GERD. ? ?The patient was last seen on 09/03/2021. At that time, the patient was advised to take omeprazole 20 mg every morning.  Had celiac disease, infectious stool testing checked which were within normal limits.  Was also given a prescription for Bentyl 3 times daily as needed and was advised to follow a low FODMAP diet. ? ?Patient reports that she has presented improvement of her abdominal pain compared to prior. She reports that she never tried the dicyclomine as she was concerned about the side effects of the medication. She reports that the abdominal cramping has significantly improved on its own, it is happening almost couple of times a month. She did not follow the diet suggested in her last appointment. She reports that she feels full very easily so she does not eat too much. ? ?She is currently having 2-5 Bms per day without blood or melena. Stool can be softer. States that she has had this for multiple years , which got worse after her cholecystectomy. ? ?She reports that she is taking omeprazole 40 mg every day compliantly. Very occasionally she has heartburn episodes, takes peptobismol to imprve the episodes. No dysphagia or odynophagia. ? ?The patient denies having any nausea, vomiting, fever, chills, hematochezia, melena, hematemesis, aundice, pruritus . Has been gaining weight even though she is not eating too much. ? ?Mother had cervix cancer , per patient had mets to the  stomach. ? ?Last Colonoscopy:01/28/17- One 5 mm polyp in the proximal ascending colon, (tubular adenoma) ?- External hemorrhoids. ?- Anal papilla(e) were hypertrophied. ? ?Recommended repeat in 2025. ? ?Last Endoscopy:03/12/05 ([facility])ESOPHAGEAL INFLAMMATION: Brushing/Esoph Inflamtn taken. ?(No malignant cells, no fungal organisms) ?Comments: whitish nodules, r/o Candida vs lymphoid follicles.  ?- Normal: Distal Esophagus to Cardia.  ?- MUCOSAL ABNORMALITY: Duodenal Bulb to Duodenal Apex. Erythematous ?mucosa. Edema present. Biopsy/Mucosal Abn taken. Benign small bowel mucosa, no active inflammation or villous atrophy ? ?Past Medical History: ?Past Medical History:  ?Diagnosis Date  ? Anxiety   ? Arthritis   ? COPD (chronic obstructive pulmonary disease) ([place])   ? Dyspnea   ? Endometriosis   ? GERD (gastroesophageal reflux disease)   ? Headache   ? High cholesterol   ? Seizures ([place])   ? 8 years ago  ? ? ?Past Surgical History: ?Past Surgical History:  ?Procedure Laterality Date  ? BACK SURGERY  09/24/2016  ? CHOLECYSTECTOMY    ? COLONOSCOPY N/A 01/28/2017  ? [facility]: one 85m polyp in proximal ascending colon (tubular adenoma), external hemorhoids, anal papillae hypertrophied  ? cyst removed from ovaries    ? DILATION AND CURETTAGE OF UTERUS    ? ESOPHAGOGASTRODUODENOSCOPY  03/12/2005  ? [facility]: esophageal inflammation, whitish nodules, erythematous mucosa duodenal bulb to duodenal apex, no malignant cells, no fungal organisms, benign small bowel mucosa, no active inflammation or villous atrophy  ? Hysterescopy    ? POLYPECTOMY  01/28/2017  ? Procedure: POLYPECTOMY;  Surgeon: NRogene Houston MD;  [facility];  Service: Endoscopy;;  colon  ? ? ?Family  History: ?Family History  ?Problem Relation Age of Onset  ? Cancer Mother   ? Cancer Father   ? Stroke Maternal Grandmother   ? ? ?Social History: ?Social History  ? ?Tobacco Use  ?Smoking Status Former  ? Packs/day: 1.00  ? Years: 35.00  ? Pack years: 35.00   ? Types: Cigarettes  ? Quit date: 06/30/2017  ? Years since quitting: 4.6  ?Smokeless Tobacco Never  ? ?Social History  ? ?Substance and Sexual Activity  ?Alcohol Use No  ? ?Social History  ? ?Substance and Sexual Activity  ?Drug Use No  ? ? ?Allergies: ?Allergies  ?Allergen Reactions  ? Codeine Nausea And Vomiting  ? Crestor [Rosuvastatin Calcium]   ?  '40mg'$  - myalgias  ? ? ?Medications: ?Current Outpatient Medications  ?Medication Sig Dispense Refill  ? acetaminophen (TYLENOL) 500 MG tablet Take 500 mg by mouth 2 (two) times daily as needed for moderate pain or headache.    ? ALPRAZolam (XANAX) 1 MG tablet Take 1 mg by mouth at bedtime.     ? atorvastatin (LIPITOR) 40 MG tablet TAKE ONE TABLET ('40MG'$  TOTAL) BY MOUTH DAILY 90 tablet 1  ? dicyclomine (BENTYL) 10 MG capsule Take 1 capsule (10 mg total) by mouth 3 (three) times daily as needed for spasms. 90 capsule 3  ? omeprazole (PRILOSEC) 20 MG capsule TAKE ONE CAPSULE BY MOUTH TWICE A DAY 90 capsule 6  ? OVER THE COUNTER MEDICATION Vit D 3 (50 mcg) 2000 IU daily.    ? ?No current facility-administered medications for this visit.  ? ? ?Review of Systems: ?GENERAL: negative for malaise, night sweats ?HEENT: No changes in hearing or vision, no nose bleeds or other nasal problems. ?NECK: Negative for lumps, goiter, pain and significant neck swelling ?RESPIRATORY: Negative for cough, wheezing ?CARDIOVASCULAR: Negative for chest pain, leg swelling, palpitations, orthopnea ?GI: SEE HPI ?MUSCULOSKELETAL: Negative for joint pain or swelling, back pain, and muscle pain. ?SKIN: Negative for lesions, rash ?PSYCH: Negative for sleep disturbance, mood disorder and recent psychosocial stressors. ?HEMATOLOGY Negative for prolonged bleeding, bruising easily, and swollen nodes. ?ENDOCRINE: Negative for cold or heat intolerance, polyuria, polydipsia and goiter. ?NEURO: negative for tremor, gait imbalance, syncope and seizures. ?The remainder of the review of systems is  noncontributory. ? ? ?Physical Exam: ?BP (!) 146/83 (BP Location: Left Arm, Patient Position: Sitting, Cuff Size: Large)   Pulse 68   Temp 98.1 ?F (36.7 ?C) (Oral)   Ht '5\' 2"'$  (1.575 m)   Wt 158 lb 4.8 oz (71.8 kg)   BMI 28.95 kg/m?  ?GENERAL: The patient is AO x3, in no acute distress. ?HEENT: Head is normocephalic and atraumatic. EOMI are intact. Mouth is well hydrated and without lesions. ?NECK: Supple. No masses ?LUNGS: Clear to auscultation. No presence of rhonchi/wheezing/rales. Adequate chest expansion ?HEART: RRR, normal s1 and s2. ?ABDOMEN: Soft, nontender, no guarding, no peritoneal signs, and nondistended. BS +. No masses. ?EXTREMITIES: Without any cyanosis, clubbing, rash, lesions or edema. ?NEUROLOGIC: AOx3, no focal motor deficit. ?SKIN: no jaundice, no rashes ? ?Imaging/Labs: ?as above ? ?I personally reviewed and interpreted the available labs, imaging and endoscopic files. ? ?Impression and Plan: ?ALEIGHNA WOJTAS is a 61 y.o. female with medical history of COPD, anxiety, GERD, seizures, hyperlipidemia, IBS-D, who presents for follow up of IBS and GERD.  The patient has been doing relatively well from the gastrointestinal perspective although she is presenting some early satiety.  We can investigate this further with a gastric emptying study to  rule out gastroparesis which will be ordered today.  She was advised to continue a low FODMAP diet but I advised her to take Bentyl if he has any abdominal pain episodes, which she can take as needed to minimize recurrence of side effects.  She is in agreement with this.  Her GERD is well controlled with omeprazole which she should continue at 40 mg every day. ? ?Schedule gastric emptying study ?Take Bentyl as needed for abdominal cramping ?Follow low FODMAP diet ?Continue omeprazole 40 mg qday ? ?All questions were answered.     ? ?Harvel Quale, MD ?Gastroenterology and Hepatology ?[facility] ? ?

## 2022-03-03 NOTE — Patient Instructions (Signed)
Schedule gastric emptying study ?Take Bentyl as needed for abdominal cramping ?Follow low FODMAP diet ?Continue omeprazole 40 mg qday ?

## 2022-03-07 ENCOUNTER — Ambulatory Visit (HOSPITAL_COMMUNITY): Payer: 59

## 2022-03-31 ENCOUNTER — Ambulatory Visit (HOSPITAL_COMMUNITY): Payer: 59

## 2022-03-31 ENCOUNTER — Encounter (HOSPITAL_COMMUNITY): Payer: Self-pay

## 2022-05-15 ENCOUNTER — Ambulatory Visit: Payer: 59 | Admitting: Dermatology

## 2022-05-15 ENCOUNTER — Encounter: Payer: Self-pay | Admitting: Dermatology

## 2022-05-15 DIAGNOSIS — Z1283 Encounter for screening for malignant neoplasm of skin: Secondary | ICD-10-CM

## 2022-05-15 DIAGNOSIS — C4442 Squamous cell carcinoma of skin of scalp and neck: Secondary | ICD-10-CM | POA: Diagnosis not present

## 2022-05-15 DIAGNOSIS — D485 Neoplasm of uncertain behavior of skin: Secondary | ICD-10-CM

## 2022-05-15 NOTE — Patient Instructions (Signed)

## 2022-06-07 ENCOUNTER — Encounter: Payer: Self-pay | Admitting: Dermatology

## 2022-06-07 NOTE — Progress Notes (Signed)
   New Patient   Subjective  Gabriela [name] is a 61 y.o. female who presents for the following: Skin Problem (Patient has a lesion on her neck. X 2 weeks its sore. History of non moles skin cancer.).  Check, new growths on front of neck Location:  Duration:  Quality:  Associated Signs/Symptoms: Modifying Factors:  Severity:  Timing: Context:    The following portions of the chart were reviewed this encounter and updated as appropriate:  Tobacco  Allergies  Meds  Problems  Med Hx  Surg Hx  Fam Hx      Objective  Well appearing patient in no apparent distress; mood and affect are within normal limits. All sun exposed areas plus back examined, no atypical pigmented spots (all checked with dermoscopy), 1 possible new nonmelanoma skin cancer neck will be biopsied and treated.  Neck - Anterior 4 mm volcano like papule suggestive of early KA/SCCA.  She did have a biopsy on her anterior neck 3 years ago that showed a wart.         All sun exposed areas plus back examined.   Assessment & Plan  Screening for malignant neoplasm of skin  Normal skin examination  Squamous cell carcinoma of skin of scalp and neck Neck - Anterior  Skin / nail biopsy Type of biopsy: tangential   Informed consent: discussed and consent obtained   Timeout: patient name, date of birth, surgical site, and procedure verified   Anesthesia: the lesion was anesthetized in a standard fashion   Anesthetic:  1% lidocaine w/ epinephrine 1-100,000 local infiltration Instrument used: flexible razor blade   Hemostasis achieved with: ferric subsulfate   Outcome: patient tolerated procedure well   Post-procedure details: wound care instructions given    Destruction of lesion Complexity: simple   Destruction method: electrodesiccation and curettage   Informed consent: discussed and consent obtained   Timeout:  patient name, date of birth, surgical site, and procedure verified Anesthesia: the lesion  was anesthetized in a standard fashion   Anesthetic:  1% lidocaine w/ epinephrine 1-100,000 local infiltration Curettage cycles:  1 Lesion length (cm):  0.6 Lesion width (cm):  0.6 Margin per side (cm):  0 Final wound size (cm):  0.6 Hemostasis achieved with:  ferric subsulfate Outcome: patient tolerated procedure well with no complications   Post-procedure details: sterile dressing applied and wound care instructions given   Dressing type: bandage and petrolatum    Specimen 1 - Surgical pathology Differential Diagnosis: KA  Check Margins: No  After shave biopsy the base was treated with curettage plus cautery

## 2022-08-11 DIAGNOSIS — R7989 Other specified abnormal findings of blood chemistry: Secondary | ICD-10-CM | POA: Diagnosis not present

## 2022-08-11 DIAGNOSIS — E785 Hyperlipidemia, unspecified: Secondary | ICD-10-CM | POA: Diagnosis not present

## 2022-08-11 DIAGNOSIS — R69 Illness, unspecified: Secondary | ICD-10-CM | POA: Diagnosis not present

## 2022-08-13 ENCOUNTER — Encounter (INDEPENDENT_AMBULATORY_CARE_PROVIDER_SITE_OTHER): Payer: Self-pay | Admitting: Gastroenterology

## 2022-08-18 DIAGNOSIS — I2510 Atherosclerotic heart disease of native coronary artery without angina pectoris: Secondary | ICD-10-CM | POA: Diagnosis not present

## 2022-08-18 DIAGNOSIS — M25519 Pain in unspecified shoulder: Secondary | ICD-10-CM | POA: Diagnosis not present

## 2022-08-18 DIAGNOSIS — Z87891 Personal history of nicotine dependence: Secondary | ICD-10-CM | POA: Diagnosis not present

## 2022-08-18 DIAGNOSIS — K219 Gastro-esophageal reflux disease without esophagitis: Secondary | ICD-10-CM | POA: Diagnosis not present

## 2022-08-18 DIAGNOSIS — M79671 Pain in right foot: Secondary | ICD-10-CM | POA: Diagnosis not present

## 2022-08-18 DIAGNOSIS — E785 Hyperlipidemia, unspecified: Secondary | ICD-10-CM | POA: Diagnosis not present

## 2022-08-18 DIAGNOSIS — R69 Illness, unspecified: Secondary | ICD-10-CM | POA: Diagnosis not present

## 2022-08-18 DIAGNOSIS — R030 Elevated blood-pressure reading, without diagnosis of hypertension: Secondary | ICD-10-CM | POA: Diagnosis not present

## 2022-08-18 DIAGNOSIS — Z0000 Encounter for general adult medical examination without abnormal findings: Secondary | ICD-10-CM | POA: Diagnosis not present

## 2022-08-18 DIAGNOSIS — I700 Atherosclerosis of aorta: Secondary | ICD-10-CM | POA: Diagnosis not present

## 2022-08-18 DIAGNOSIS — R1011 Right upper quadrant pain: Secondary | ICD-10-CM | POA: Diagnosis not present

## 2022-09-04 ENCOUNTER — Ambulatory Visit (INDEPENDENT_AMBULATORY_CARE_PROVIDER_SITE_OTHER): Payer: Self-pay | Admitting: Gastroenterology

## 2022-09-10 DIAGNOSIS — R69 Illness, unspecified: Secondary | ICD-10-CM | POA: Diagnosis not present

## 2022-09-10 DIAGNOSIS — Z1231 Encounter for screening mammogram for malignant neoplasm of breast: Secondary | ICD-10-CM | POA: Diagnosis not present

## 2022-09-10 DIAGNOSIS — Z6828 Body mass index (BMI) 28.0-28.9, adult: Secondary | ICD-10-CM | POA: Diagnosis not present

## 2022-09-10 DIAGNOSIS — Z01419 Encounter for gynecological examination (general) (routine) without abnormal findings: Secondary | ICD-10-CM | POA: Diagnosis not present

## 2022-09-10 DIAGNOSIS — Z124 Encounter for screening for malignant neoplasm of cervix: Secondary | ICD-10-CM | POA: Diagnosis not present

## 2022-10-13 ENCOUNTER — Ambulatory Visit: Payer: 59 | Admitting: Podiatry

## 2022-10-15 ENCOUNTER — Encounter (INDEPENDENT_AMBULATORY_CARE_PROVIDER_SITE_OTHER): Payer: Self-pay | Admitting: Gastroenterology

## 2022-10-28 ENCOUNTER — Ambulatory Visit (INDEPENDENT_AMBULATORY_CARE_PROVIDER_SITE_OTHER): Payer: Self-pay | Admitting: Gastroenterology

## 2022-11-04 ENCOUNTER — Ambulatory Visit: Payer: 59 | Admitting: Podiatry

## 2022-11-04 ENCOUNTER — Ambulatory Visit (INDEPENDENT_AMBULATORY_CARE_PROVIDER_SITE_OTHER): Payer: 59

## 2022-11-04 DIAGNOSIS — M7751 Other enthesopathy of right foot: Secondary | ICD-10-CM | POA: Diagnosis not present

## 2022-11-04 DIAGNOSIS — G5761 Lesion of plantar nerve, right lower limb: Secondary | ICD-10-CM

## 2022-11-04 DIAGNOSIS — M79671 Pain in right foot: Secondary | ICD-10-CM

## 2022-11-04 NOTE — Progress Notes (Signed)
Subjective: Chief Complaint  Patient presents with   Foot Pain    Pain located in the right foot, pain located in the ball of foot, and 4th and 5th digit, pain has been going on for 2 years, prior treatment was injections via Dr. Paulla Dolly but patient not comfortable with them    62 year old female presents the office today with the above concerns.  This that she has been ongoing for quite some time.  She saw Dr. Paulla Dolly in 2022 for the same issue.  She has pain in the ball of the foot and into the third and fourth toes.  She does not experience numbness or tingling or burning.  She also points to a long area of the bunion where she has pain at times as well.  She has tried shoe modifications, inserts.  No injuries.   Objective: AAO x3, NAD DP/PT pulses palpable bilaterally, CRT less than 3 seconds The majority tenderness is localized on the interspace of the third interspace but she also gets that submetatarsal.  There is trace edema there is no erythema or warmth.  There is no area pinpoint tenderness otherwise.Marland Kitchen  Extensor tendons appear to be intact. No pain with calf compression, swelling, warmth, erythema  Assessment: Capsulitis MPJ, chronic foot pain  Plan: -All treatment options discussed with the patient including all alternatives, risks, complications.  -Repeat x-rays were obtained reviewed.  3 views of the foot were obtained.  There is no evidence of acute fracture. -Given her ongoing symptoms despite multiple steroid treatments for over 1 year recommended MRI which was ordered today.  In the meantime continue with shoes with arch supports.  Will discuss further treatment options pending the MRI. -Patient encouraged to call the office with any questions, concerns, change in symptoms.   Trula Slade DPM

## 2022-11-12 DIAGNOSIS — R319 Hematuria, unspecified: Secondary | ICD-10-CM | POA: Diagnosis not present

## 2022-11-12 DIAGNOSIS — L900 Lichen sclerosus et atrophicus: Secondary | ICD-10-CM | POA: Diagnosis not present

## 2022-11-24 ENCOUNTER — Ambulatory Visit
Admission: RE | Admit: 2022-11-24 | Discharge: 2022-11-24 | Disposition: A | Discharge status: Home / Self Care | Payer: 59 | Source: Ambulatory Visit | Attending: Podiatry | Admitting: Podiatry

## 2022-11-24 DIAGNOSIS — M7751 Other enthesopathy of right foot: Secondary | ICD-10-CM

## 2022-11-24 DIAGNOSIS — M19071 Primary osteoarthritis, right ankle and foot: Secondary | ICD-10-CM | POA: Diagnosis not present

## 2022-12-15 ENCOUNTER — Ambulatory Visit: Payer: 59 | Admitting: Podiatry

## 2022-12-15 DIAGNOSIS — G5761 Lesion of plantar nerve, right lower limb: Secondary | ICD-10-CM | POA: Diagnosis not present

## 2022-12-15 MED ORDER — TRIAMCINOLONE ACETONIDE 10 MG/ML IJ SUSP
10.0000 mg | Freq: Once | INTRAMUSCULAR | Status: AC
  Administered 2022-12-15: 10 mg

## 2022-12-15 NOTE — Patient Instructions (Signed)

## 2022-12-17 NOTE — Progress Notes (Signed)
Subjective: Chief Complaint  Patient presents with   Neuroma    MRI results right foot   62 year old female presents today to further discuss MRI results as well as for steroid injection which we discussed over the phone.  She states that she still gets pain to her foot and no significant change.  No recent injury or change otherwise.  Objective: AAO x3, NAD DP/PT pulses palpable bilaterally, CRT less than 3 seconds She is continue get symptoms on her right foot along the second third interspace.  There is no area pinpoint tenderness.  There is some slight edema present but there is no erythema or warmth.  No pain with MTPJ range of motion. No pain with calf compression, swelling, warmth, erythema  Assessment: 62 year old female with neuroma, capsulitis  Plan: -All treatment options discussed with the patient including all alternatives, risks, complications.  -Again reviewed the MRI with her. -Steroid injection performed today.  Skin was cleaned with alcohol and mixture of 1 cc Kenalog 10, 0.5 cc of Marcaine plain, 0.5 cc of lidocaine plain was infiltrated into the second, third interspace without complications.  Postinjection care discussed.  I added a metatarsal pad to her inserts as well.  Continue with supportive shoes, offloading. -Patient encouraged to call the office with any questions, concerns, change in symptoms.   Trula Slade DPM

## 2022-12-22 ENCOUNTER — Ambulatory Visit (INDEPENDENT_AMBULATORY_CARE_PROVIDER_SITE_OTHER): Payer: Self-pay | Admitting: Gastroenterology

## 2023-01-12 ENCOUNTER — Ambulatory Visit: Payer: 59 | Admitting: Podiatry

## 2023-01-12 DIAGNOSIS — G5761 Lesion of plantar nerve, right lower limb: Secondary | ICD-10-CM

## 2023-01-12 NOTE — Progress Notes (Unsigned)
Subjective: No chief complaint on file.   62 year old female presents today for follow evaluation of the neuroma in the right foot.  She is doing last injection did help some.  No recent injury or changes otherwise.  No other concerns today.   Objective: AAO x3, NAD DP/PT pulses palpable bilaterally, CRT less than 3 seconds Distal discomfort noted along the right foot along the second third interspace.  This may be somewhat improved.  She still gets numbness and nerve symptoms into the second and third toes.  There is no area pinpoint tenderness.  There is some slight edema present but there is no erythema or warmth.  No pain with MTPJ range of motion. No pain with calf compression, swelling, warmth, erythema  Assessment: 62 year old female with neuroma, capsulitis  Plan: -All treatment options discussed with the patient including all alternatives, risks, complications.  -Discussed with conservative and surgical options.  Discussed various conservative treatments as well.  Will pursue another steroid injection.  I added a metatarsal pad to her insert. -Steroid injection performed today.  Skin was cleaned with alcohol and mixture of 1 cc dexamethasone phosphate, Marcaine plain was infiltrated into the second, third interspace without complications.  Postinjection care discussed.  I added a metatarsal pad to her inserts as well.  Continue with supportive shoes, offloading. -Patient encouraged to call the office with any questions, concerns, change in symptoms.   Trula Slade DPM

## 2023-02-04 DIAGNOSIS — F411 Generalized anxiety disorder: Secondary | ICD-10-CM | POA: Diagnosis not present

## 2023-02-04 DIAGNOSIS — R000 Tachycardia, unspecified: Secondary | ICD-10-CM | POA: Diagnosis not present

## 2023-02-04 DIAGNOSIS — H538 Other visual disturbances: Secondary | ICD-10-CM | POA: Diagnosis not present

## 2023-02-04 DIAGNOSIS — R030 Elevated blood-pressure reading, without diagnosis of hypertension: Secondary | ICD-10-CM | POA: Diagnosis not present

## 2023-02-05 DIAGNOSIS — H04123 Dry eye syndrome of bilateral lacrimal glands: Secondary | ICD-10-CM | POA: Diagnosis not present

## 2023-02-10 ENCOUNTER — Ambulatory Visit: Payer: 59 | Admitting: Podiatry

## 2023-02-12 ENCOUNTER — Ambulatory Visit
Admission: RE | Admit: 2023-02-12 | Discharge: 2023-02-12 | Disposition: A | Discharge status: Home / Self Care | Payer: 59 | Source: Ambulatory Visit | Attending: Nurse Practitioner | Admitting: Nurse Practitioner

## 2023-02-12 VITALS — BP 189/98 | HR 98 | Temp 98.2°F | Resp 18

## 2023-02-12 DIAGNOSIS — Z1152 Encounter for screening for COVID-19: Secondary | ICD-10-CM

## 2023-02-12 DIAGNOSIS — J069 Acute upper respiratory infection, unspecified: Secondary | ICD-10-CM | POA: Diagnosis not present

## 2023-02-12 LAB — POCT RAPID STREP A (OFFICE): Rapid Strep A Screen: NEGATIVE

## 2023-02-12 MED ORDER — OLOPATADINE HCL 0.1 % OP SOLN: 1.0000 [drp] | Freq: Two times a day (BID) | OPHTHALMIC | 0 refills | Status: AC

## 2023-02-12 MED ORDER — LIDOCAINE VISCOUS HCL 2 % MT SOLN: 5.0000 mL | Freq: Four times a day (QID) | OROMUCOSAL | 0 refills | Status: AC | PRN

## 2023-02-12 MED ORDER — PROMETHAZINE-DM 6.25-15 MG/5ML PO SYRP: 5.0000 mL | ORAL_SOLUTION | Freq: Every evening | ORAL | 0 refills | Status: AC | PRN

## 2023-02-12 NOTE — Discharge Instructions (Addendum)
The rapid strep test was negative.  A throat culture and COVID test have been ordered.  If your COVID test is positive, you are a candidate to receive molnupiravir, but medication must be started no later than 02/13/2023. Take medication as prescribed. Increase fluids and allow for plenty of rest.  Try to drink at least 8-10 8 ounce glasses of water daily. Warm salt water gargles 3-4 times daily while throat pain persist. Recommend a soft diet such as soup, broth, yogurt, pudding, or Jell-O while throat pain persist. If your symptoms do not improve over the next 5 to 7 days, recommend following up in this clinic or with your primary care physician for further evaluation. Follow-up as needed.

## 2023-02-12 NOTE — ED Provider Notes (Signed)
[facility]    CSN: [id number] Arrival date & time: 02/12/23  1255      History   Chief Complaint Chief Complaint  Patient presents with   Eye Drainage   Sore Throat    HPI Gabriela SCHMELZER is a 62 y.o. female.   The history is provided by the patient.   The patient presents with a 4-day history of sore throat, decreased appetite, headache, fatigue, cough and drainage from the left eye.  Patient denies fever, chills, ear pain, wheezing, shortness of breath, difficulty breathing, abdominal pain, nausea, vomiting, or diarrhea.  Patient denies any obvious known sick contacts.  Reports she has been taking Tylenol Cold and sinus for her symptoms with minimal relief.  Past Medical History:  Diagnosis Date   Anxiety    Arthritis    Atypical mole 12/06/2001   minimal left calf   Atypical mole 12/06/2001   mild left thigh   Atypical mole 12/06/2001   mild left upper back   COPD (chronic obstructive pulmonary disease)    Dyspnea    Endometriosis    GERD (gastroesophageal reflux disease)    Headache    High cholesterol    Seizures    8 years ago   Squamous cell carcinoma of skin 08/05/1988   left neck KA    Patient Active Problem List   Diagnosis Date Noted   Early satiety 03/03/2022   Gastroesophageal reflux disease 09/03/2021   Coronary artery calcification 06/24/2017   Dyslipidemia 06/24/2017   Smoker 06/24/2017   Hx of colonic polyps 07/28/2016   Irritable bowel syndrome 12/29/2011   COLONIC POLYPS, HYPERPLASTIC, HX OF 08/28/2009   ANXIETY 08/22/2009   UNSPECIFIED ESOPHAGITIS 06/01/2009   DUODENITIS WITHOUT MENTION OF HEMORRHAGE 06/01/2009   ENDOMETRIOSIS 06/01/2009   FLATULENCE ERUCTATION AND GAS PAIN 06/01/2009    Past Surgical History:  Procedure Laterality Date   BACK SURGERY  09/24/2016   CHOLECYSTECTOMY     COLONOSCOPY N/A 01/28/2017   rehman: one 5mm polyp in proximal ascending colon (tubular adenoma), external hemorhoids, anal papillae  hypertrophied   cyst removed from ovaries     DILATION AND CURETTAGE OF UTERUS     ESOPHAGOGASTRODUODENOSCOPY  03/12/2005   Bartlett: esophageal inflammation, whitish nodules, erythematous mucosa duodenal bulb to duodenal apex, no malignant cells, no fungal organisms, benign small bowel mucosa, no active inflammation or villous atrophy   Hysterescopy     POLYPECTOMY  01/28/2017   Procedure: POLYPECTOMY;  Surgeon: Malissa Hippo, MD;  Location: [facility];  Service: Endoscopy;;  colon    OB History   No obstetric history on file.      Home Medications    Prior to Admission medications   Medication Sig Start Date End Date Taking? Authorizing Provider  acetaminophen (TYLENOL) 500 MG tablet Take 500 mg by mouth 2 (two) times daily as needed for moderate pain or headache.    [provider]  ALPRAZolam Prudy Feeler) 1 MG tablet Take 1 mg by mouth at bedtime.     [provider]  atorvastatin (LIPITOR) 40 MG tablet TAKE ONE TABLET (40MG  TOTAL) BY MOUTH DAILY 10/25/18   Hilty, Lisette Abu, MD  dicyclomine (BENTYL) 10 MG capsule Take 1 capsule (10 mg total) by mouth 3 (three) times daily as needed for spasms. 09/03/21   Carlan, Chelsea L, NP  omeprazole (PRILOSEC) 20 MG capsule TAKE ONE CAPSULE BY MOUTH TWICE A DAY 09/22/18   Newman Pies, MD  OVER THE COUNTER MEDICATION Vit  D 3 (50 mcg) 2000 IU daily.    [provider]    Family History Family History  Problem Relation Age of Onset   Cancer Mother    Cancer Father    Stroke Maternal Grandmother     Social History Social History   Tobacco Use   Smoking status: Former    Packs/day: 1.00    Years: 35.00    Additional pack years: 0.00    Total pack years: 35.00    Types: Cigarettes    Quit date: 06/30/2017    Years since quitting: 5.6   Smokeless tobacco: Never  Vaping Use   Vaping Use: Never used  Substance Use Topics   Alcohol use: No   Drug use: No     Allergies   Codeine and Crestor [rosuvastatin  calcium]   Review of Systems Review of Systems Per HPI  Physical Exam Triage Vital Signs ED Triage Vitals  Enc Vitals Group     BP 02/12/23 1337 (!) 189/98     Pulse Rate 02/12/23 1337 98     Resp 02/12/23 1337 18     Temp 02/12/23 1337 98.2 F (36.8 C)     Temp Source 02/12/23 1337 Oral     SpO2 02/12/23 1337 96 %     Weight --      Height --      Head Circumference --      Peak Flow --      Pain Score 02/12/23 1338 0     Pain Loc --      Pain Edu? --      Excl. in GC? --    No data found.  Updated Vital Signs BP (!) 189/98 (BP Location: Right Arm)   Pulse 98   Temp 98.2 F (36.8 C) (Oral)   Resp 18   SpO2 96%   Visual Acuity Right Eye Distance:   Left Eye Distance:   Bilateral Distance:    Right Eye Near:   Left Eye Near:    Bilateral Near:     Physical Exam Vitals and nursing note reviewed.  Constitutional:      General: She is not in acute distress.    Appearance: She is well-developed.  HENT:     Head: Normocephalic.     Right Ear: Tympanic membrane and ear canal normal.     Left Ear: Tympanic membrane and ear canal normal.     Nose: No congestion.     Mouth/Throat:     Mouth: Mucous membranes are moist.     Pharynx: Uvula midline. Pharyngeal swelling and posterior oropharyngeal erythema present. No oropharyngeal exudate or uvula swelling.     Tonsils: No tonsillar exudate. 1+ on the right. 1+ on the left.  Eyes:     General: Lids are normal. No visual field deficit.       Left eye: No foreign body or discharge.     Extraocular Movements: Extraocular movements intact.     Left eye: Normal extraocular motion and no nystagmus.     Conjunctiva/sclera: Conjunctivae normal.     Comments: Left conjunctiva erythematous.  No drainage is present at this time.  No periorbital swelling present.  Cardiovascular:     Rate and Rhythm: Normal rate and regular rhythm.     Heart sounds: Normal heart sounds.  Pulmonary:     Effort: Pulmonary effort is normal.  No respiratory distress.     Breath sounds: Normal breath sounds. No stridor. No wheezing, rhonchi  or rales.  Abdominal:     General: Bowel sounds are normal.     Palpations: Abdomen is soft.     Tenderness: There is no abdominal tenderness.  Musculoskeletal:     Cervical back: Normal range of motion.  Lymphadenopathy:     Cervical: No cervical adenopathy.  Skin:    General: Skin is warm and dry.  Neurological:     General: No focal deficit present.     Mental Status: She is alert and oriented to person, place, and time.  Psychiatric:        Mood and Affect: Mood normal.        Behavior: Behavior normal.      UC Treatments / Results  Labs (all labs ordered are listed, but only abnormal results are displayed) Labs Reviewed  POCT RAPID STREP A (OFFICE)    EKG   Radiology No results found.  Procedures Procedures (including critical care time)  Medications Ordered in UC Medications - No data to display  Initial Impression / Assessment and Plan / UC Course  I have reviewed the triage vital signs and the nursing notes.  Pertinent labs & imaging results that were available during my care of the patient were reviewed by me and considered in my medical decision making (see chart for details).  The patient is well-appearing, she is in no acute distress, vital signs are stable.  Rapid strep test is negative.  Throat culture and COVID test are pending.  If the COVID test is positive, patient is a candidate to receive molnupiravir.  Viscous lidocaine 2% was provided to the patient to gargle and spit for throat pain or discomfort.  For her cough, she was prescribed Promethazine DM for bedtime.  Supportive care recommendations were provided and discussed with the patient to include increasing fluids, allowing for plenty of rest, and warm salt water gargles 3-4 times daily while symptoms persist.  Patient was advised that symptoms appear to be of viral etiology.  Discussed with  patient viral course and duration of symptoms, and when follow-up may be indicated.  Patient was in agreement with this plan of care and verbalized understanding.  All questions were answered.  Patient stable for discharge.   Final Clinical Impressions(s) / UC Diagnoses   Final diagnoses:  None   Discharge Instructions   None    ED Prescriptions   None    PDMP not reviewed this encounter.   Abran Cantor, NP 02/12/23 1640

## 2023-02-12 NOTE — ED Triage Notes (Signed)
Pt reports her throat has been sore, loss of appetite, she's fatigue, and has left eye oozing and redness x 4 days

## 2023-02-13 LAB — SARS CORONAVIRUS 2 (TAT 6-24 HRS): SARS Coronavirus 2: NEGATIVE

## 2023-02-14 LAB — CULTURE, GROUP A STREP (THRC)

## 2023-02-15 LAB — CULTURE, GROUP A STREP (THRC)

## 2023-02-17 ENCOUNTER — Ambulatory Visit: Payer: 59 | Admitting: Podiatry

## 2023-03-03 DIAGNOSIS — R030 Elevated blood-pressure reading, without diagnosis of hypertension: Secondary | ICD-10-CM | POA: Diagnosis not present

## 2023-03-06 DIAGNOSIS — R030 Elevated blood-pressure reading, without diagnosis of hypertension: Secondary | ICD-10-CM | POA: Diagnosis not present

## 2023-03-16 ENCOUNTER — Ambulatory Visit: Payer: 59 | Admitting: Podiatry

## 2023-03-16 DIAGNOSIS — G5761 Lesion of plantar nerve, right lower limb: Secondary | ICD-10-CM | POA: Diagnosis not present

## 2023-03-16 NOTE — Progress Notes (Signed)
Subjective: Chief Complaint  Patient presents with   Neuroma    Pt stated that she is still having a lot of discomfort in her right foot     62 year old female presents today for follow evaluation of the neuroma in the right foot.  She states the nerve symptoms on the toes have improved but she feels pain in the ball of her foot more now.  No injuries or changes otherwise.    Objective: AAO x3, NAD DP/PT pulses palpable bilaterally, CRT less than 3 seconds There is still discomfort noted along the right foot along the second third interspace.  Upon palpation she still gets nerve symptoms in the second and third toes.  No pain with calf compression, swelling, warmth, erythema  Assessment: 62 year old female with neuroma, capsulitis  Plan: -All treatment options discussed with the patient including all alternatives, risks, complications.  -Discussed with conservative and surgical options.  Discussed various conservative treatments as well.  Will switch her to address close alcohol injections understanding risks. -Skin was cleaned with alcohol and mixture of 1.2 mL of dehydrated sclerosing alcohol was infiltrated in the erythematous on the neuroma, second interspace.  She tolerated well.  Postinjection care discussed.  Continue offloading.  Return in about 3 weeks (around 04/06/2023) for Injection .  Vivi Barrack DPM

## 2023-03-16 NOTE — Patient Instructions (Signed)

## 2023-04-10 ENCOUNTER — Ambulatory Visit: Payer: 59 | Admitting: Podiatry

## 2023-04-10 ENCOUNTER — Encounter: Payer: Self-pay | Admitting: Podiatry

## 2023-04-10 DIAGNOSIS — G5761 Lesion of plantar nerve, right lower limb: Secondary | ICD-10-CM

## 2023-04-10 DIAGNOSIS — E785 Hyperlipidemia, unspecified: Secondary | ICD-10-CM | POA: Diagnosis not present

## 2023-04-12 NOTE — Progress Notes (Unsigned)
Subjective: Chief Complaint  Patient presents with   Foot Pain    Follow up neuroma right   "Its not any better yet"    62 year old female presents today for follow evaluation of the neuroma in the right foot.  She presents she states the nerve symptoms on the toes have improved but she feels pain in the ball of her foot more   .  No injuries or changes otherwise.    Objective: AAO x3, NAD DP/PT pulses palpable bilaterally, CRT less than 3 seconds There is still discomfort noted along the right foot along the second third interspace.  Upon palpation she still gets nerve symptoms in the second and third toes.  No pain with calf compression, swelling, warmth, erythema  Assessment: 62 year old female with neuroma, capsulitis  Plan: -All treatment options discussed with the patient including all alternatives, risks, complications.  -Discussed with conservative and surgical options.  Discussed various conservative treatments as well.  Will switch her to address close alcohol injections understanding risks. -Skin was cleaned with alcohol and mixture of 1.2 mL of dehydrated sclerosing alcohol was infiltrated in the erythematous on the neuroma, second interspace.  She tolerated well.  Postinjection care discussed.  Continue offloading.  Return in about 3 weeks (around 04/06/2023) for Injection .  Vivi Barrack DPM

## 2023-04-29 DIAGNOSIS — M79645 Pain in left finger(s): Secondary | ICD-10-CM | POA: Diagnosis not present

## 2023-05-08 ENCOUNTER — Ambulatory Visit: Payer: 59 | Admitting: Podiatry

## 2023-05-08 DIAGNOSIS — G5761 Lesion of plantar nerve, right lower limb: Secondary | ICD-10-CM

## 2023-05-08 NOTE — Progress Notes (Signed)
Subjective: Chief Complaint  Patient presents with   Follow-up    4 weeks for neuorma injection    62 year old female presents today for follow evaluation of the neuroma in the right foot. Some improvement.  Agrees to proceed with another injection.  Objective: AAO x3, NAD DP/PT pulses palpable bilaterally, CRT less than 3 seconds There is still discomfort noted along the right foot along the second  and third interspace.  Upon palpation she still gets nerve symptoms in the second and third toes.  No pain with calf compression, swelling, warmth, erythema  Assessment: 62 year old female with neuroma, capsulitis  Plan: -All treatment options discussed with the patient including all alternatives, risks, complications.  -Discussed with conservative and surgical options.  Discussed various conservative treatments as well.   -Will proceed today with her injection.  Skin was cleaned with alcohol and mixture of 1.2 mL total of dehydrated sclerosing alcohol was infiltrated in the area of the neuroma, second and third interspace.  She tolerated well.  Postinjection care discussed.  Continue offloading.  Return in about 3 weeks (around 05/29/2023) for neruoma, injection.  Vivi Barrack DPM

## 2023-05-29 ENCOUNTER — Ambulatory Visit: Payer: 59 | Admitting: Podiatry

## 2023-05-29 DIAGNOSIS — M7751 Other enthesopathy of right foot: Secondary | ICD-10-CM

## 2023-05-29 DIAGNOSIS — G5761 Lesion of plantar nerve, right lower limb: Secondary | ICD-10-CM | POA: Diagnosis not present

## 2023-05-29 MED ORDER — METHYLPREDNISOLONE 4 MG PO TBPK: ORAL_TABLET | ORAL | 0 refills | Status: AC

## 2023-05-29 NOTE — Progress Notes (Unsigned)
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  Epat if needed

## 2023-07-10 ENCOUNTER — Ambulatory Visit: Payer: 59 | Admitting: Podiatry

## 2023-07-10 DIAGNOSIS — L84 Corns and callosities: Secondary | ICD-10-CM | POA: Diagnosis not present

## 2023-07-10 DIAGNOSIS — G5761 Lesion of plantar nerve, right lower limb: Secondary | ICD-10-CM

## 2023-07-10 NOTE — Patient Instructions (Signed)
You can use UREA 40% CREAM for the callus

## 2023-07-13 NOTE — Progress Notes (Signed)
Subjective: Chief Complaint  Patient presents with   Follow-up    Right foot follow up. Patient understand she needs surgery but she is waiting for the winter time to proceed with the surgery.    Eczema    Dry skin around right hallux. Skin is peeling. No pain or itchiness.     62 year old female presents today for follow evaluation of the neuroma in the right foot.  States that she and her pain is about the same.  Patient also concerned about dry skin and peeling skin on her feet.  No issues that she reports.   Objective: AAO x3, NAD DP/PT pulses palpable bilaterally, CRT less than 3 seconds There is still discomfort noted along the right foot along the second and third interspace.  Upon palpation she still gets nerve symptoms in the second and third toes.  Appears like lesion toe without any underlying ulceration drainage or signs of infection.  No area of pinpoint tenderness.  No pain with calf compression, swelling, warmth, erythema  Assessment: 62 year old female with neuroma, capsulitis; callus  Plan: -All treatment options discussed with the patient including all alternatives, risks, complications.  -Discussed with conservative and surgical options.  Discussed various conservative treatments as well.  We do not have any further injections have been having helpful.  Consider other treatment options.  We discussed surgical intervention.  She is also a new MRI.  Ordered MRI with contrast to further evaluate this area. -Recommended urea cream for the callus  Vivi Barrack DPM

## 2023-08-24 DIAGNOSIS — M79645 Pain in left finger(s): Secondary | ICD-10-CM | POA: Diagnosis not present

## 2023-08-24 DIAGNOSIS — F411 Generalized anxiety disorder: Secondary | ICD-10-CM | POA: Diagnosis not present

## 2023-08-24 DIAGNOSIS — J449 Chronic obstructive pulmonary disease, unspecified: Secondary | ICD-10-CM | POA: Diagnosis not present

## 2023-08-24 DIAGNOSIS — E785 Hyperlipidemia, unspecified: Secondary | ICD-10-CM | POA: Diagnosis not present

## 2023-08-24 DIAGNOSIS — I2510 Atherosclerotic heart disease of native coronary artery without angina pectoris: Secondary | ICD-10-CM | POA: Diagnosis not present

## 2023-08-24 DIAGNOSIS — R6889 Other general symptoms and signs: Secondary | ICD-10-CM | POA: Diagnosis not present

## 2023-08-24 DIAGNOSIS — I10 Essential (primary) hypertension: Secondary | ICD-10-CM | POA: Diagnosis not present

## 2023-08-24 DIAGNOSIS — R82998 Other abnormal findings in urine: Secondary | ICD-10-CM | POA: Diagnosis not present

## 2023-08-24 DIAGNOSIS — Z0000 Encounter for general adult medical examination without abnormal findings: Secondary | ICD-10-CM | POA: Diagnosis not present

## 2023-08-24 DIAGNOSIS — F1320 Sedative, hypnotic or anxiolytic dependence, uncomplicated: Secondary | ICD-10-CM | POA: Diagnosis not present

## 2023-08-24 DIAGNOSIS — I700 Atherosclerosis of aorta: Secondary | ICD-10-CM | POA: Diagnosis not present

## 2023-08-24 DIAGNOSIS — R109 Unspecified abdominal pain: Secondary | ICD-10-CM | POA: Diagnosis not present

## 2023-08-24 DIAGNOSIS — M79671 Pain in right foot: Secondary | ICD-10-CM | POA: Diagnosis not present

## 2023-09-14 DIAGNOSIS — R109 Unspecified abdominal pain: Secondary | ICD-10-CM | POA: Diagnosis not present

## 2023-09-28 DIAGNOSIS — Z01419 Encounter for gynecological examination (general) (routine) without abnormal findings: Secondary | ICD-10-CM | POA: Diagnosis not present

## 2023-09-28 DIAGNOSIS — Z6829 Body mass index (BMI) 29.0-29.9, adult: Secondary | ICD-10-CM | POA: Diagnosis not present

## 2023-09-28 DIAGNOSIS — R319 Hematuria, unspecified: Secondary | ICD-10-CM | POA: Diagnosis not present

## 2023-09-28 DIAGNOSIS — Z1231 Encounter for screening mammogram for malignant neoplasm of breast: Secondary | ICD-10-CM | POA: Diagnosis not present

## 2023-09-28 DIAGNOSIS — Z124 Encounter for screening for malignant neoplasm of cervix: Secondary | ICD-10-CM | POA: Diagnosis not present

## 2023-09-28 DIAGNOSIS — Z1151 Encounter for screening for human papillomavirus (HPV): Secondary | ICD-10-CM | POA: Diagnosis not present

## 2023-09-29 ENCOUNTER — Encounter (INDEPENDENT_AMBULATORY_CARE_PROVIDER_SITE_OTHER): Payer: Self-pay | Admitting: *Deleted

## 2023-10-19 ENCOUNTER — Encounter: Payer: Self-pay | Admitting: Podiatry

## 2023-10-26 ENCOUNTER — Other Ambulatory Visit: Payer: 59

## 2023-11-23 DIAGNOSIS — Z87891 Personal history of nicotine dependence: Secondary | ICD-10-CM | POA: Diagnosis not present

## 2023-11-25 ENCOUNTER — Telehealth: Payer: Self-pay | Admitting: Podiatry

## 2023-11-25 NOTE — Telephone Encounter (Signed)
Patient is requesting handicap parking permit before next appointment on December 08, 2023. Current handicap parking permit expires at the end of January 2025.

## 2023-12-02 ENCOUNTER — Telehealth: Payer: Self-pay | Admitting: Podiatry

## 2023-12-02 ENCOUNTER — Encounter: Payer: Self-pay | Admitting: Podiatry

## 2023-12-02 NOTE — Telephone Encounter (Signed)
 Patient is requesting Consulting civil engineer. Previous parking permit expired in January.

## 2023-12-08 ENCOUNTER — Ambulatory Visit
Admission: RE | Admit: 2023-12-08 | Discharge: 2023-12-08 | Disposition: A | Discharge status: Home / Self Care | Payer: Medicare Other | Source: Ambulatory Visit | Attending: Podiatry | Admitting: Podiatry

## 2023-12-08 DIAGNOSIS — R2241 Localized swelling, mass and lump, right lower limb: Secondary | ICD-10-CM | POA: Diagnosis not present

## 2023-12-08 DIAGNOSIS — G5761 Lesion of plantar nerve, right lower limb: Secondary | ICD-10-CM

## 2023-12-08 MED ORDER — GADOPICLENOL 0.5 MMOL/ML IV SOLN
7.5000 mL | Freq: Once | INTRAVENOUS | Status: AC | PRN
  Administered 2023-12-08: 7.5 mL via INTRAVENOUS

## 2023-12-18 ENCOUNTER — Encounter: Payer: Self-pay | Admitting: Podiatry

## 2023-12-25 DIAGNOSIS — J111 Influenza due to unidentified influenza virus with other respiratory manifestations: Secondary | ICD-10-CM | POA: Diagnosis not present

## 2023-12-25 DIAGNOSIS — J45901 Unspecified asthma with (acute) exacerbation: Secondary | ICD-10-CM | POA: Diagnosis not present

## 2024-01-08 DIAGNOSIS — Z6829 Body mass index (BMI) 29.0-29.9, adult: Secondary | ICD-10-CM | POA: Diagnosis not present

## 2024-01-08 DIAGNOSIS — Z9889 Other specified postprocedural states: Secondary | ICD-10-CM | POA: Diagnosis not present

## 2024-01-15 ENCOUNTER — Encounter (INDEPENDENT_AMBULATORY_CARE_PROVIDER_SITE_OTHER): Payer: Self-pay | Admitting: *Deleted

## 2024-02-08 ENCOUNTER — Ambulatory Visit: Admitting: Podiatry

## 2024-02-08 DIAGNOSIS — M7751 Other enthesopathy of right foot: Secondary | ICD-10-CM

## 2024-02-08 DIAGNOSIS — G5761 Lesion of plantar nerve, right lower limb: Secondary | ICD-10-CM | POA: Diagnosis not present

## 2024-02-11 NOTE — Progress Notes (Signed)
 Subjective: Chief Complaint  Patient presents with   Foot Pain    RM#13 Follow up on right foot pain patient states still no improvements.     63 year old female presents today for follow evaluation of the neuroma in the right foot.  States that she still gets discomfort in this area but she is also having new concerns of pain along her bunion that she points to.  No recent injury or changes in activity that she reports.  She said that she likes to do anything to try to avoid surgery for her foot.   Objective: AAO x3, NAD DP/PT pulses palpable bilaterally, CRT less than 3 seconds There is still discomfort noted along the right foot along the second and third interspace.  Upon palpation she still gets nerve symptoms in the second and third toes.  There is mild bunion present with localized tenderness along the area of prominence of the first metatarsal head.  There is no increased temperature noted.  There is faint erythema which I think is coming more from inflammation as opposed to infection.  No significant pain with range of motion of the MTPJ. No pain with calf compression, swelling, warmth, erythema  Assessment: 63 year old female with neuroma, capsulitis  Plan: -All treatment options discussed with the patient including all alternatives, risks, complications.  -We discussed both conservative as well as surgical intervention.  She is not interested in surgery at this time and would like to try to avoid this.  In regards to the chronic foot pain we discussed trying shockwave treatment number to get her scheduled for this.  Continue shoe modifications, offloading. -In regards to the bunion, capsulitis we discussed steroid injection.  She would to hold off on this.  I dispensed offloading pads.  Discussed shoes to avoid excess pressure to the area.  Charity Conch DPM

## 2024-02-17 ENCOUNTER — Ambulatory Visit: Admitting: Podiatry

## 2024-02-17 DIAGNOSIS — G5761 Lesion of plantar nerve, right lower limb: Secondary | ICD-10-CM

## 2024-02-17 NOTE — Progress Notes (Signed)
 Subjective: No chief complaint on file.    63 year old female presents today for follow evaluation of the neuroma in the right foot and foot pain.  She was to proceed with the shockwave treatment today.  No recent injury or changes otherwise.  She states that she will try to avoid surgery if possible.  Objective: AAO x3, NAD DP/PT pulses palpable bilaterally, CRT less than 3 seconds There is still discomfort noted along the right foot along the second and third interspace.  There is mild bunion present with localized tenderness along the area of prominence of the first metatarsal head.  There is no significant erythema today.  No pain with calf compression, swelling, warmth, erythema  Assessment: 63 year old female with neuroma, capsulitis  Plan: -All treatment options discussed with the patient including all alternatives, risks, complications.  -We again discussed with conservative as well as surgical options.  Send to proceed with shockwave treatment today.  I then performed shockwave to the plantar aspect of the right foot with energy of 16 frequency of 6 for total 2000 shocks.  She tolerated well.  Discussed with point anti-inflammatories, icing.   Continue with supportive shoe gear.  Charity Conch DPM

## 2024-02-23 ENCOUNTER — Ambulatory Visit: Admitting: Podiatry

## 2024-02-23 DIAGNOSIS — G5761 Lesion of plantar nerve, right lower limb: Secondary | ICD-10-CM

## 2024-02-23 DIAGNOSIS — M7751 Other enthesopathy of right foot: Secondary | ICD-10-CM

## 2024-02-24 NOTE — Progress Notes (Signed)
 Subjective: Chief Complaint  Patient presents with   Neuroma    Pt presents for Follow up on right foot pain patient states still having pain.       63 year old female presents today for follow evaluation of the neuroma in the right foot and foot pain.  She was to proceed with another treatment of the shockwave therapy today.  States that she has been having pain over the last couple of days.    Objective: AAO x3, NAD DP/PT pulses palpable bilaterally, CRT less than 3 seconds There is still discomfort noted along the right foot along the second and third interspace.  There is mild bunion present with localized tenderness along the area of prominence of the first metatarsal head.  There is no significant erythema today.  No pain with calf compression, swelling, warmth, erythema  Assessment: 63 year old female with neuroma, capsulitis  Plan: -All treatment options discussed with the patient including all alternatives, risks, complications.  -We again discussed with conservative as well as surgical options.  Send to proceed with shockwave treatment today.  I then performed shockwave to the plantar aspect of the right foot with energy of 70 frequency of  for total 2000 shocks.  She tolerated well.  Discussed with point anti-inflammatories, icing.   Continue with supportive shoe gear.  Charity Conch DPM

## 2024-03-01 ENCOUNTER — Ambulatory Visit: Admitting: Podiatry

## 2024-03-08 DIAGNOSIS — K080 Exfoliation of teeth due to systemic causes: Secondary | ICD-10-CM | POA: Diagnosis not present

## 2024-05-02 DIAGNOSIS — L821 Other seborrheic keratosis: Secondary | ICD-10-CM | POA: Diagnosis not present

## 2024-05-02 DIAGNOSIS — L570 Actinic keratosis: Secondary | ICD-10-CM | POA: Diagnosis not present

## 2024-05-02 DIAGNOSIS — D224 Melanocytic nevi of scalp and neck: Secondary | ICD-10-CM | POA: Diagnosis not present

## 2024-05-02 DIAGNOSIS — D22111 Melanocytic nevi of right upper eyelid, including canthus: Secondary | ICD-10-CM | POA: Diagnosis not present

## 2024-05-02 DIAGNOSIS — D485 Neoplasm of uncertain behavior of skin: Secondary | ICD-10-CM | POA: Diagnosis not present

## 2024-05-02 DIAGNOSIS — L820 Inflamed seborrheic keratosis: Secondary | ICD-10-CM | POA: Diagnosis not present

## 2024-05-02 DIAGNOSIS — L720 Epidermal cyst: Secondary | ICD-10-CM | POA: Diagnosis not present

## 2024-05-09 DIAGNOSIS — K080 Exfoliation of teeth due to systemic causes: Secondary | ICD-10-CM | POA: Diagnosis not present

## 2024-05-24 DIAGNOSIS — H5203 Hypermetropia, bilateral: Secondary | ICD-10-CM | POA: Diagnosis not present

## 2024-05-27 ENCOUNTER — Telehealth: Payer: Self-pay | Admitting: Podiatry

## 2024-05-27 NOTE — Telephone Encounter (Signed)
 Patient reports that her handicap placard will expire soon and is requesting a renewal. She states she is due for surgery but is currently unable to proceed with it.  She will be in the [place] area on Monday and would like to come by to pick up the renewal form at that time.  Please advise. Thank you

## 2024-05-30 DIAGNOSIS — M8588 Other specified disorders of bone density and structure, other site: Secondary | ICD-10-CM | POA: Diagnosis not present

## 2024-05-30 DIAGNOSIS — N390 Urinary tract infection, site not specified: Secondary | ICD-10-CM | POA: Diagnosis not present

## 2024-05-30 DIAGNOSIS — R300 Dysuria: Secondary | ICD-10-CM | POA: Diagnosis not present

## 2024-07-04 DIAGNOSIS — R3129 Other microscopic hematuria: Secondary | ICD-10-CM | POA: Diagnosis not present

## 2024-07-04 DIAGNOSIS — R300 Dysuria: Secondary | ICD-10-CM | POA: Diagnosis not present

## 2024-08-10 ENCOUNTER — Encounter (INDEPENDENT_AMBULATORY_CARE_PROVIDER_SITE_OTHER): Payer: Self-pay | Admitting: Gastroenterology

## 2024-08-22 DIAGNOSIS — E785 Hyperlipidemia, unspecified: Secondary | ICD-10-CM | POA: Diagnosis not present

## 2024-08-22 DIAGNOSIS — Z1212 Encounter for screening for malignant neoplasm of rectum: Secondary | ICD-10-CM | POA: Diagnosis not present

## 2024-08-22 DIAGNOSIS — Z1389 Encounter for screening for other disorder: Secondary | ICD-10-CM | POA: Diagnosis not present

## 2024-08-29 DIAGNOSIS — I10 Essential (primary) hypertension: Secondary | ICD-10-CM | POA: Diagnosis not present

## 2024-08-29 DIAGNOSIS — R82998 Other abnormal findings in urine: Secondary | ICD-10-CM | POA: Diagnosis not present

## 2024-08-29 DIAGNOSIS — J449 Chronic obstructive pulmonary disease, unspecified: Secondary | ICD-10-CM | POA: Diagnosis not present

## 2024-08-29 DIAGNOSIS — Z0000 Encounter for general adult medical examination without abnormal findings: Secondary | ICD-10-CM | POA: Diagnosis not present

## 2024-09-06 DIAGNOSIS — R0602 Shortness of breath: Secondary | ICD-10-CM | POA: Diagnosis not present

## 2024-09-06 DIAGNOSIS — R079 Chest pain, unspecified: Secondary | ICD-10-CM | POA: Diagnosis not present

## 2024-10-11 DIAGNOSIS — Z01419 Encounter for gynecological examination (general) (routine) without abnormal findings: Secondary | ICD-10-CM | POA: Diagnosis not present

## 2024-10-11 DIAGNOSIS — Z6829 Body mass index (BMI) 29.0-29.9, adult: Secondary | ICD-10-CM | POA: Diagnosis not present

## 2024-10-11 DIAGNOSIS — Z1231 Encounter for screening mammogram for malignant neoplasm of breast: Secondary | ICD-10-CM | POA: Diagnosis not present

## 2024-10-12 ENCOUNTER — Encounter (INDEPENDENT_AMBULATORY_CARE_PROVIDER_SITE_OTHER): Payer: Self-pay | Admitting: *Deleted

## 2024-10-12 ENCOUNTER — Encounter: Payer: Self-pay | Admitting: Obstetrics and Gynecology

## 2024-10-13 ENCOUNTER — Other Ambulatory Visit: Payer: Self-pay | Admitting: Obstetrics and Gynecology

## 2024-10-13 DIAGNOSIS — E785 Hyperlipidemia, unspecified: Secondary | ICD-10-CM

## 2024-10-24 ENCOUNTER — Other Ambulatory Visit

## 2024-11-11 ENCOUNTER — Ambulatory Visit
Admission: RE | Admit: 2024-11-11 | Discharge: 2024-11-11 | Disposition: A | Discharge status: Home / Self Care | Source: Ambulatory Visit | Attending: Obstetrics and Gynecology | Admitting: Obstetrics and Gynecology

## 2024-11-11 DIAGNOSIS — E785 Hyperlipidemia, unspecified: Secondary | ICD-10-CM
